# Patient Record
Sex: Male | Born: 1960 | Race: White | Hispanic: No | Marital: Single | Smoking: Current every day smoker
Health system: Southern US, Community
[De-identification: ages and names within clinical notes are randomized; demographics above are authoritative.]

---

## 2010-12-13 ENCOUNTER — Ambulatory Visit: Payer: Self-pay | Admitting: Family

## 2011-02-21 ENCOUNTER — Inpatient Hospital Stay: Payer: Self-pay | Admitting: Internal Medicine

## 2011-03-04 ENCOUNTER — Inpatient Hospital Stay: Payer: Self-pay | Admitting: Emergency Medicine

## 2012-04-07 IMAGING — CR DG CHEST 2V
1 series · 2 of 2 positions shown · non-contrast
Comparison: none

REASON FOR EXAM: cough,chronic
COMMENTS:

[Series 1: view not recorded · 0.17mm/px · 2 of 2 slices shown]
[im 1/2]
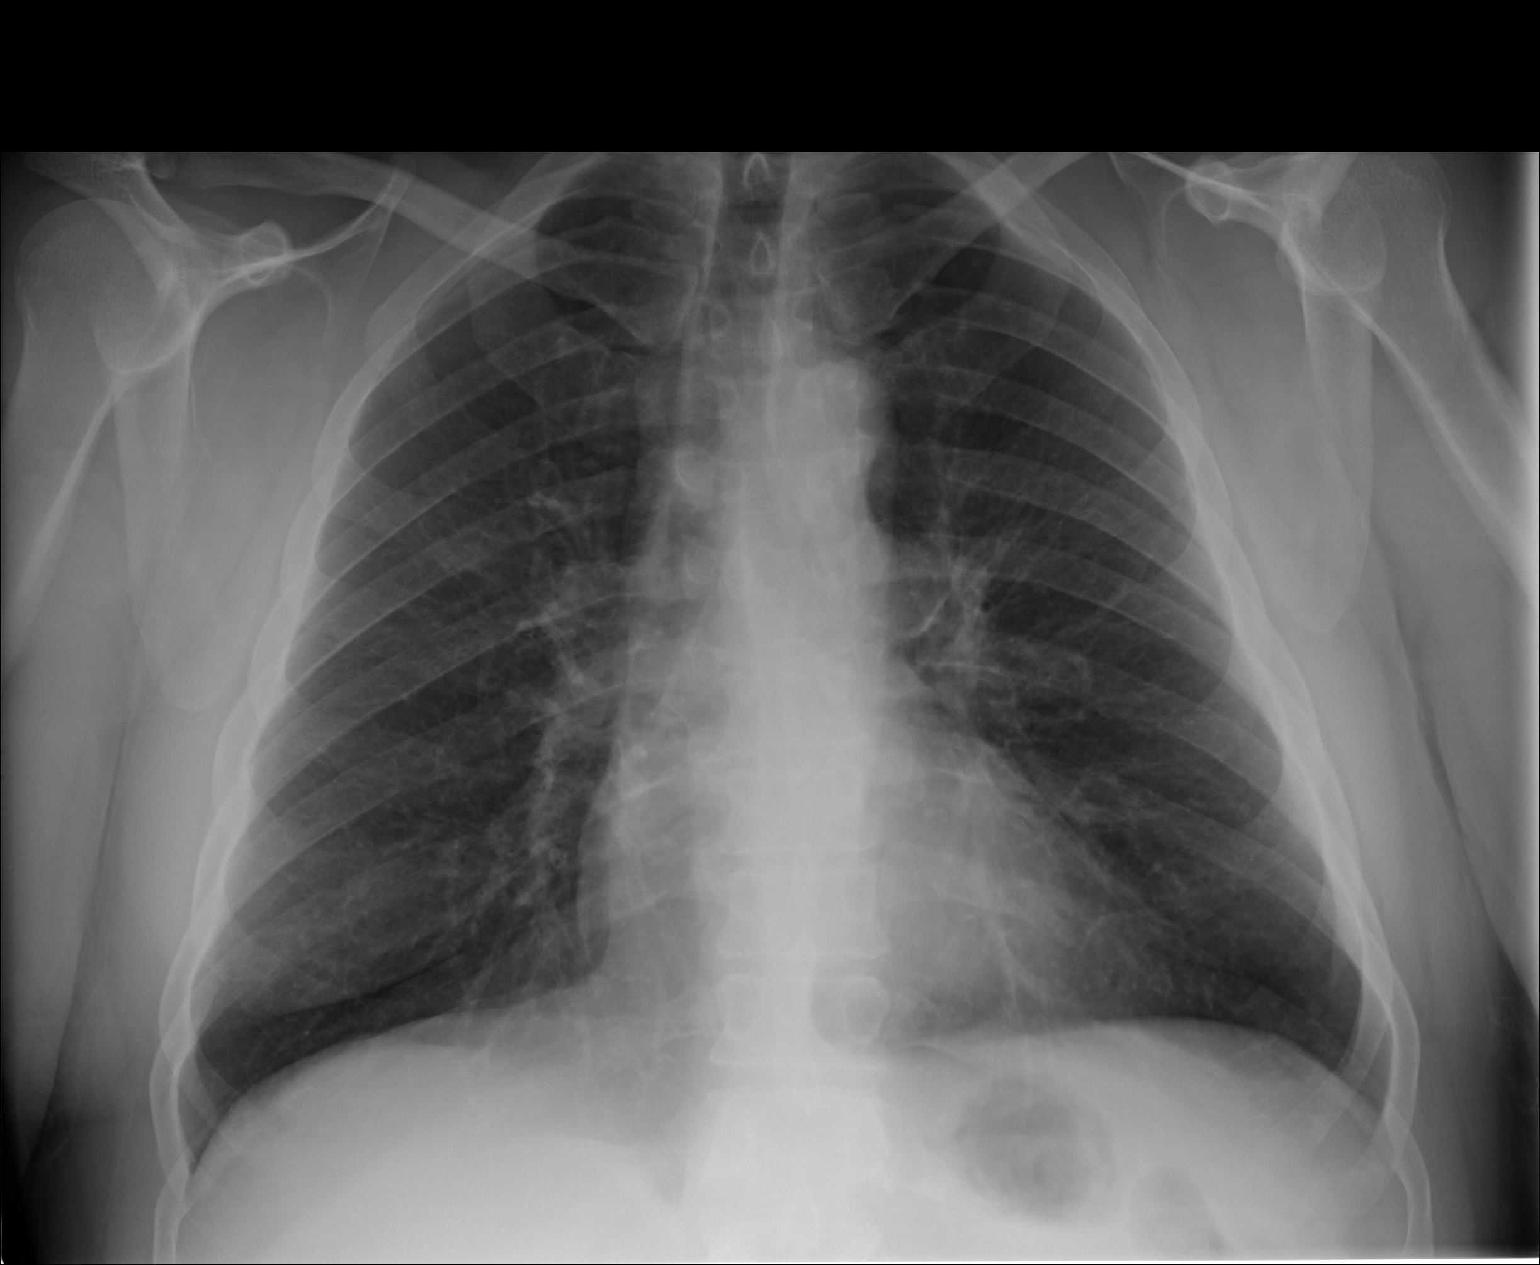
[im 2/2]
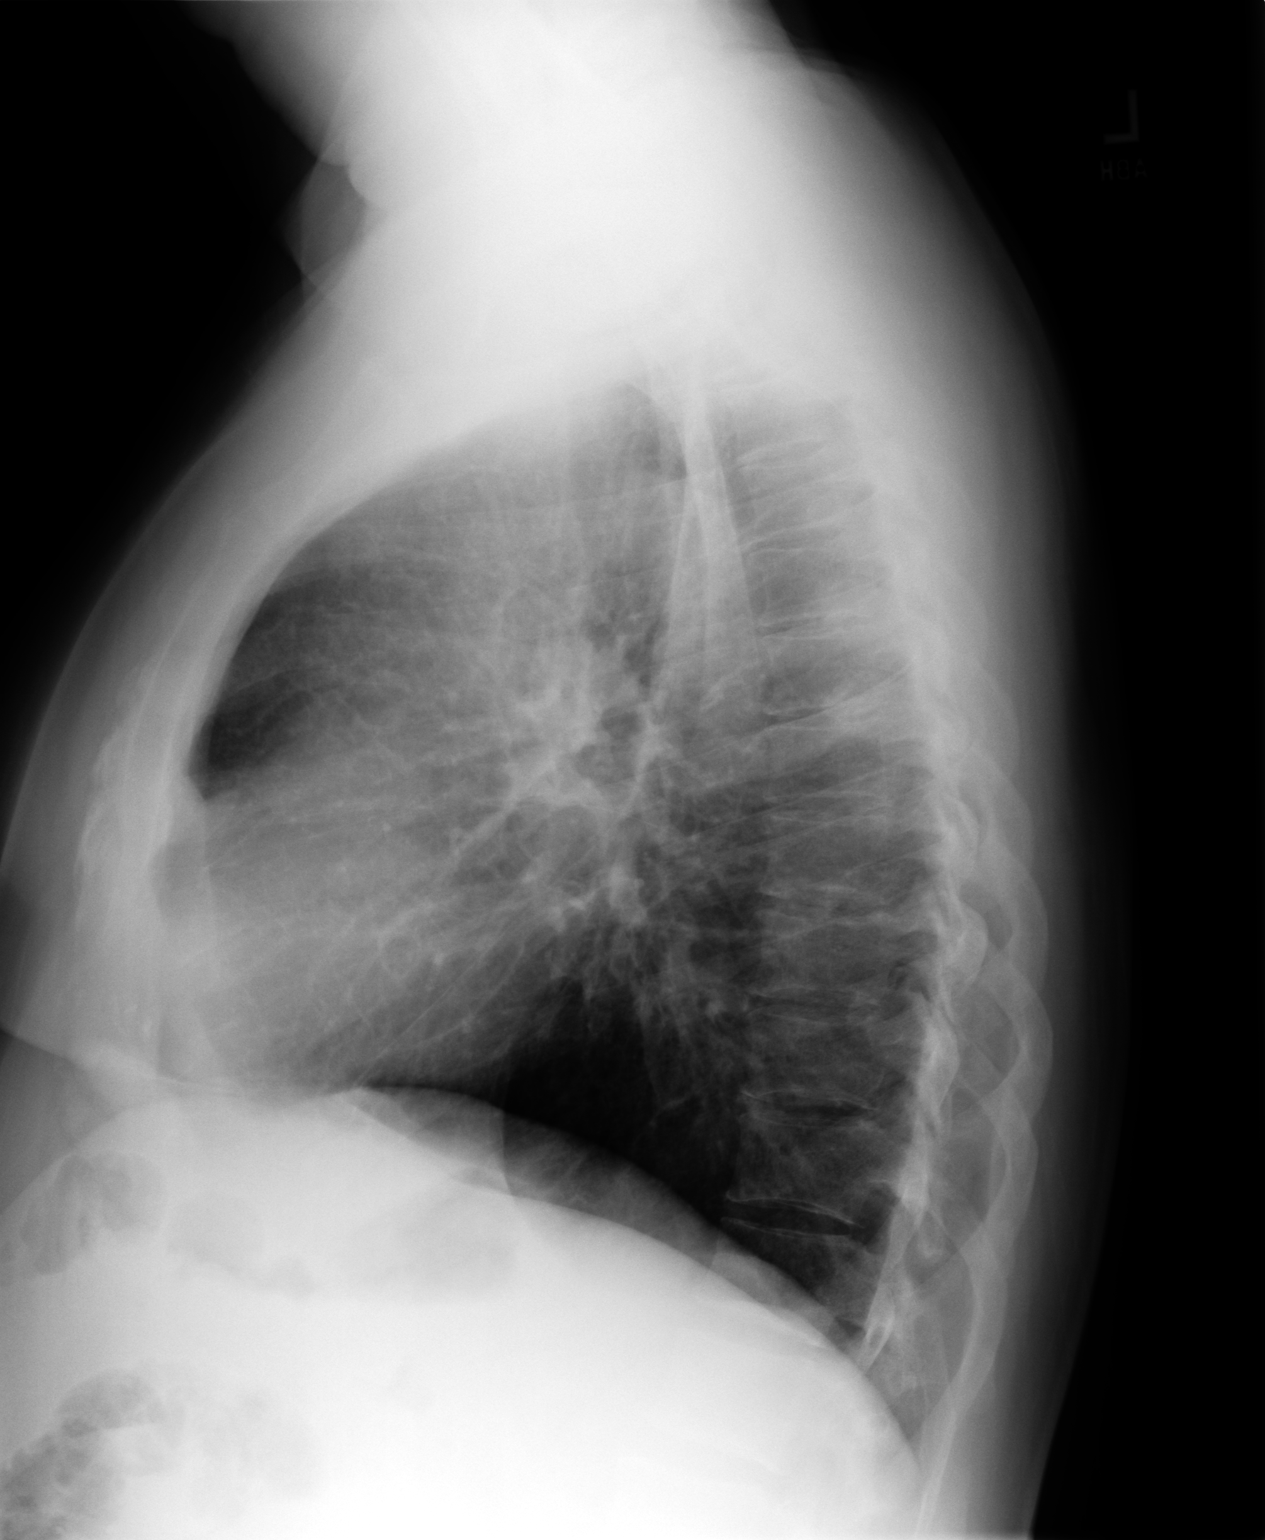

[2 of 2 positions shown; findings below may reference images not displayed]

PROCEDURE:     DXR - DXR CHEST PA (OR AP) AND LATERAL  - December 13, 2010 [DATE]

RESULT:     There is no previous exam for comparison.

The lungs are clear. The heart and pulmonary vessels are normal. The bony
and mediastinal structures are unremarkable. There is no effusion. There is
no pneumothorax or evidence of congestive failure.
IMPRESSION: No acute cardiopulmonary disease.

## 2012-06-26 IMAGING — CT CT ABD-PELV W/ CM
1 of 2 series · 15 of 32 positions shown, 19 images · IV contrast (isovue)
Comparison: none

REASON FOR EXAM: (1) abdominal pain; (2) same
COMMENTS:

PROCEDURE:     CT  - CT ABDOMEN / PELVIS  W  - March 04, 2011 [DATE]
RESULT:     Comparison:  None
TECHNIQUE: Multiple axial images of the abdomen and pelvis were performed
from the lung bases to the pubic symphysis, with p.o. contrast and with 100
mL of Isovue 370 intravenous contrast.

[Series 2: abdomen · axial · 0.78mm/px · z∈[-373,+62]mm · 15 of 95 slices shown, 19 images]
[im 4/95  soft-tissue]
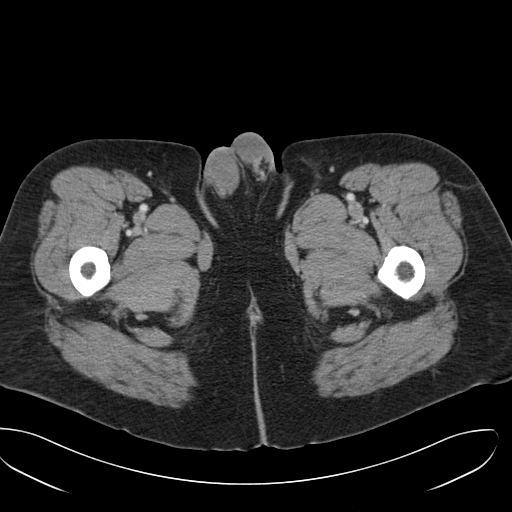
[im 4/95  bone]
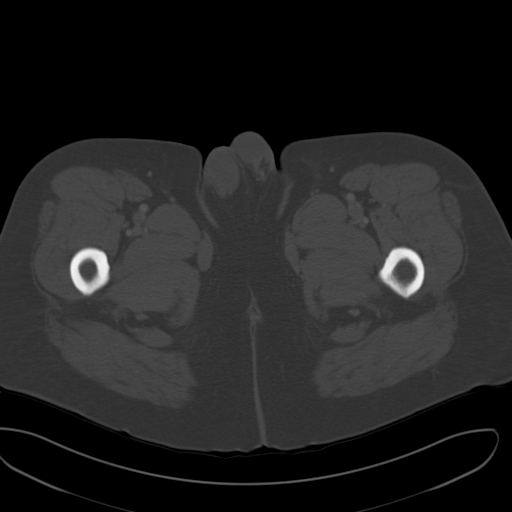
[im 11/95  soft-tissue]
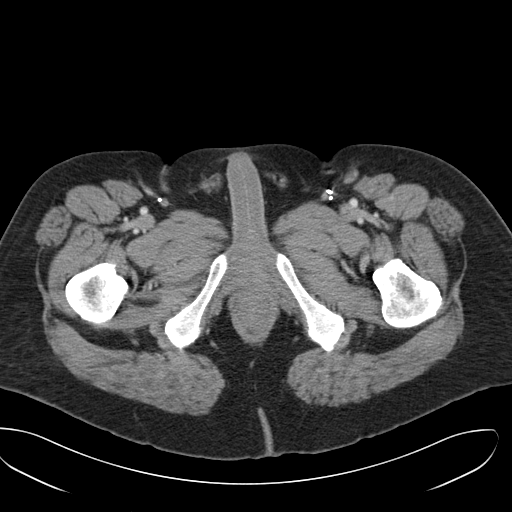
[im 19/95  soft-tissue]
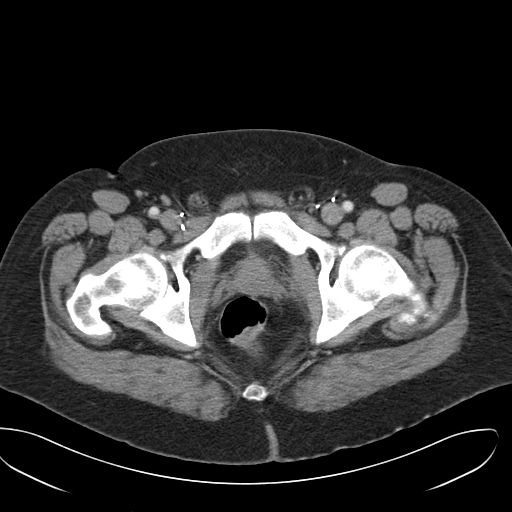
[im 26/95  soft-tissue]
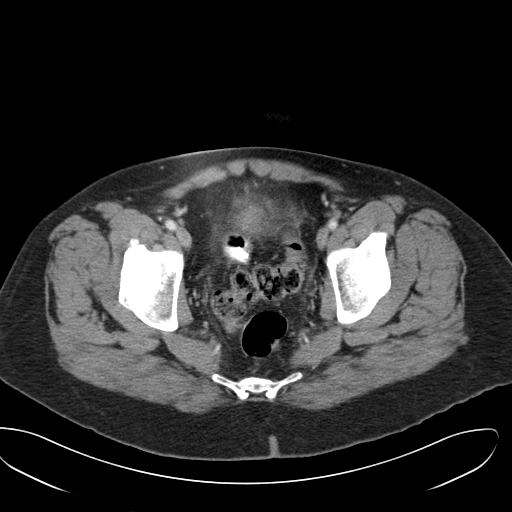
[im 33/95  soft-tissue]
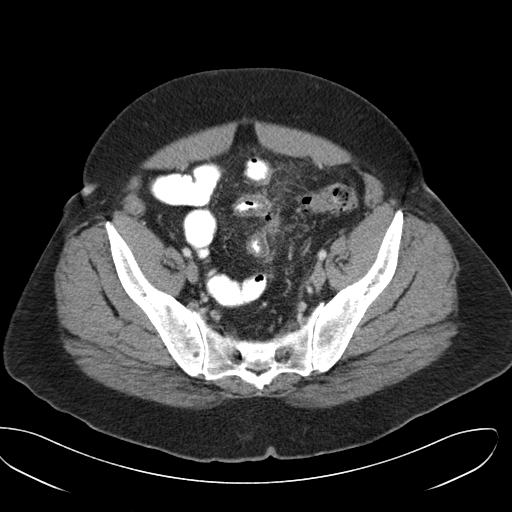
[im 40/95  soft-tissue]
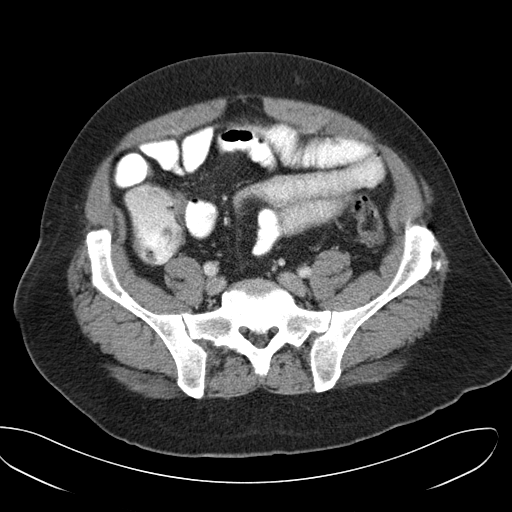
[im 48/95  soft-tissue]
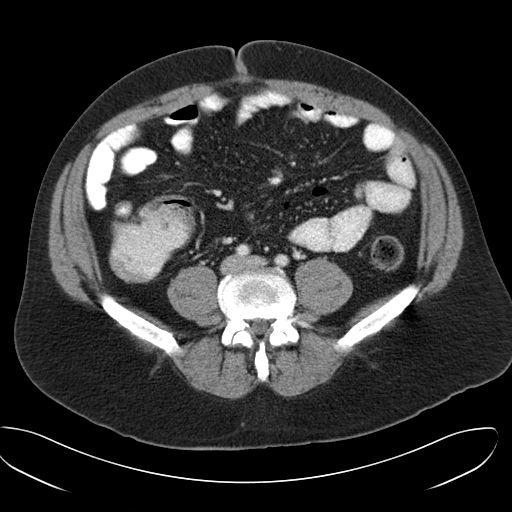
[im 55/95  soft-tissue]
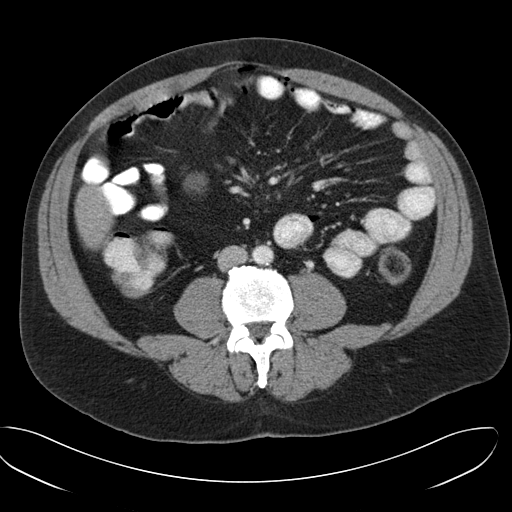
[im 62/95  soft-tissue]
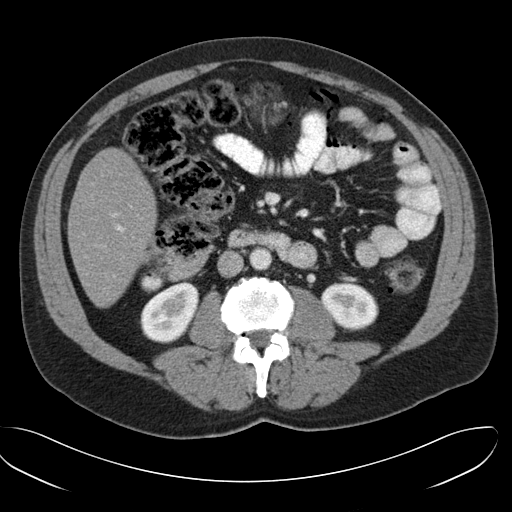
[im 62/95  bone]
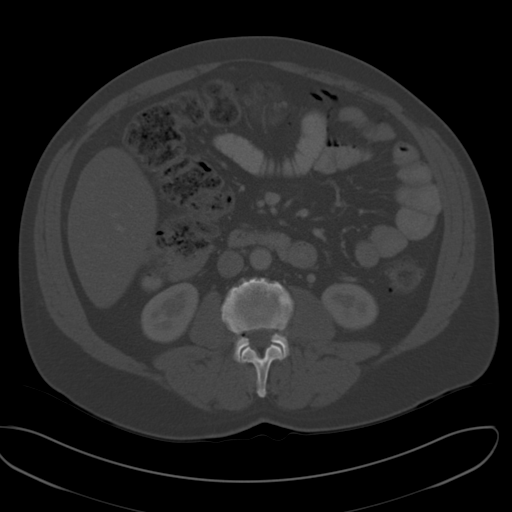
[im 69/95  soft-tissue]
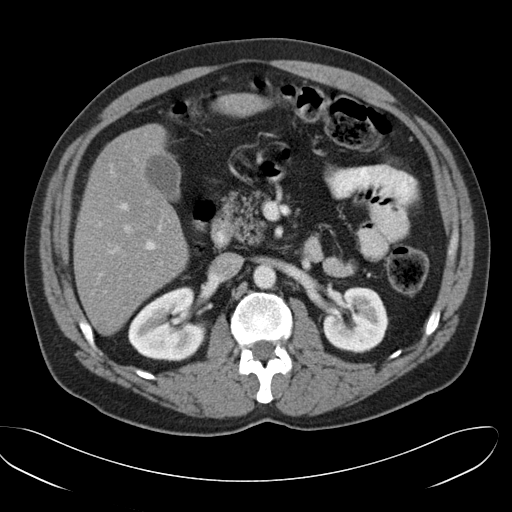
[im 76/95  soft-tissue]
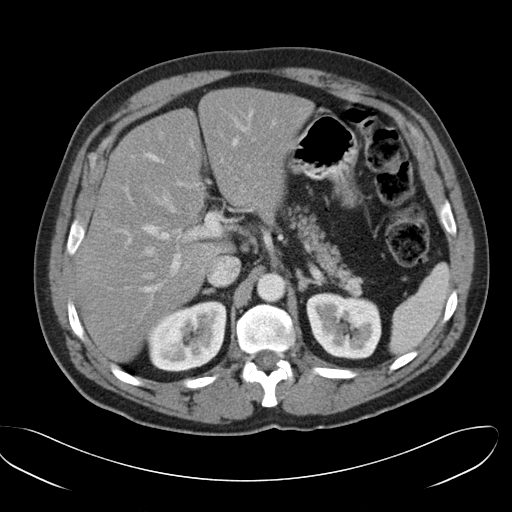
[im 80/95  lung]
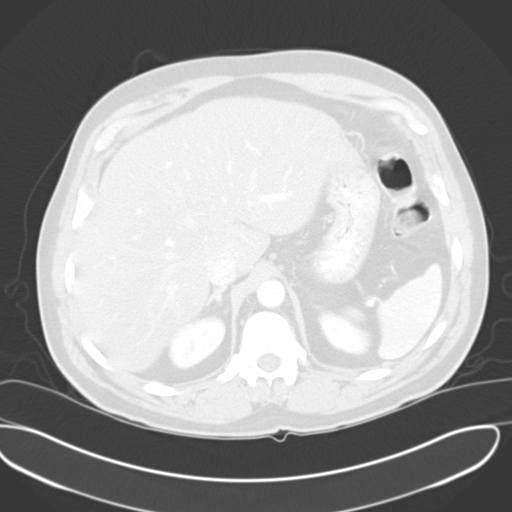
[im 84/95  soft-tissue]
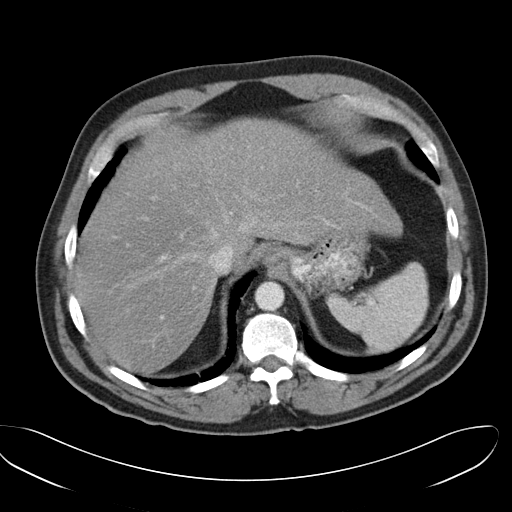
[im 84/95  lung]
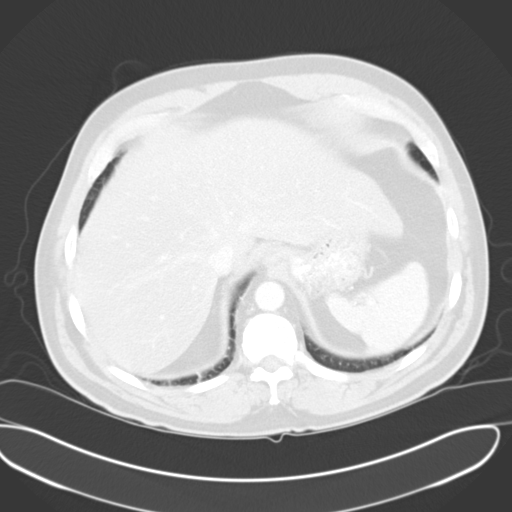
[im 87/95  lung]
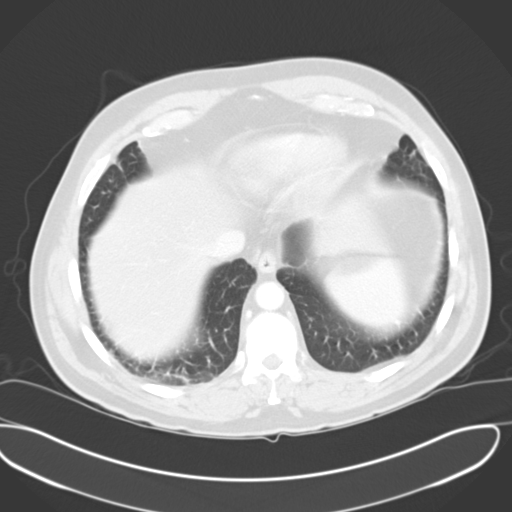
[im 91/95  soft-tissue]
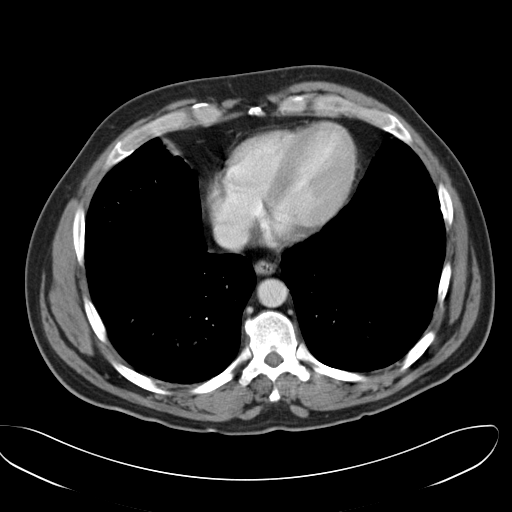
[im 91/95  lung]
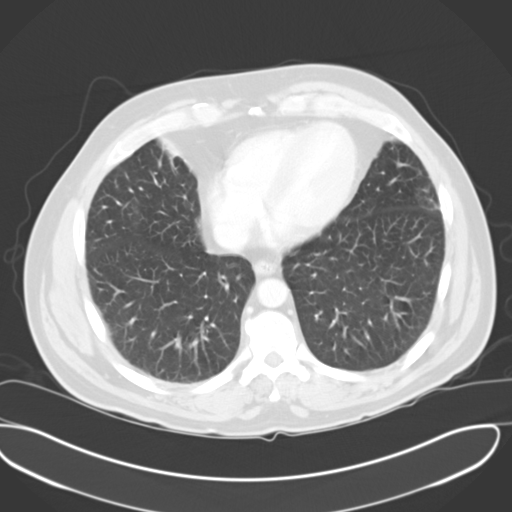

[15 of 32 positions shown; findings below may reference images not displayed]

FINDINGS: The liver is somewhat low in attenuation, suggesting hepatic steatosis. The
spleen, adrenals, pancreas, and gallbladder are unremarkable. The kidneys
enhance normally. No hydronephrosis.

There is focal bowel wall thickening of a loop of small bowel in the
anterior pelvis. There is diverticulosis of the sigmoid colon. There is mild
thickening of the sigmoid colon. There are adjacent extraluminal foci of
air, consistent perforation. There is mild thickening of the adjacent small
bowel loops, which is felt to be reactive.

The appendix is not definitely visualized. However, there are no
inflammatory changes around the base of the cecum. There is thickening of
the bladder wall, circumferentially. This may be related to underdistention.
Surgical clips are seen in the groin, bilaterally.

No aggressive lytic or sclerotic osseous lesions identified.
IMPRESSION: 1. Free intraperitoneal air within the pelvis and abdominal mesentery,
consistent with hollow viscus perforation. There is diverticulosis of the
sigmoid colon with mild bowel wall thickening, felt to represent perforated
sigmoid diverticulitis. Bowel wall thickening of adjacent small bowel is
felt to be reactive.
2. Thickening of the bladder wall may be secondary to underdistention. There
are adjacent inflammatory changes which could represent cystitis or reactive
changes from the aforementioned process. Correlate with urinalysis.

The preliminary report was called to Dr. Charley Adderley at 0338 hours
03/04/2011 by the preliminary radiologist Dr. Lumi Dehart.

## 2013-11-21 ENCOUNTER — Encounter: Payer: Self-pay | Admitting: Surgery

## 2017-11-30 ENCOUNTER — Encounter: Payer: BLUE CROSS/BLUE SHIELD | Attending: Surgery | Admitting: Surgery

## 2017-11-30 DIAGNOSIS — Z7984 Long term (current) use of oral hypoglycemic drugs: Secondary | ICD-10-CM | POA: Insufficient documentation

## 2017-11-30 DIAGNOSIS — J449 Chronic obstructive pulmonary disease, unspecified: Secondary | ICD-10-CM | POA: Insufficient documentation

## 2017-11-30 DIAGNOSIS — E119 Type 2 diabetes mellitus without complications: Secondary | ICD-10-CM | POA: Insufficient documentation

## 2017-11-30 DIAGNOSIS — K435 Parastomal hernia without obstruction or  gangrene: Secondary | ICD-10-CM | POA: Diagnosis not present

## 2017-11-30 DIAGNOSIS — E11622 Type 2 diabetes mellitus with other skin ulcer: Secondary | ICD-10-CM | POA: Diagnosis not present

## 2017-11-30 DIAGNOSIS — F17218 Nicotine dependence, cigarettes, with other nicotine-induced disorders: Secondary | ICD-10-CM | POA: Diagnosis not present

## 2017-11-30 DIAGNOSIS — X58XXXA Exposure to other specified factors, initial encounter: Secondary | ICD-10-CM | POA: Insufficient documentation

## 2017-11-30 DIAGNOSIS — S31104A Unspecified open wound of abdominal wall, left lower quadrant without penetration into peritoneal cavity, initial encounter: Secondary | ICD-10-CM | POA: Insufficient documentation

## 2017-12-01 NOTE — Progress Notes (Signed)
KAMARIAN, SAHAKIAN ([id number]) Visit Report for 11/30/2017 Chief Complaint Document Details Patient Name: Stephen [name], Stephen [name] Date of Service: 11/30/2017 8:00 AM Medical Record Number: [id number] Patient Account Number: [id number] Date of Birth/Sex: Nov 07, 1961 (56 y.o. Male) Treating RN: Curtis Sites Primary Care Provider: Karie Fetch Other Clinician: Referring Provider: Karie Fetch Treating Provider/Extender: Rudene Re in Treatment: 0 Information Obtained from: Patient Chief Complaint ulcerated area around his left stoma for about 3 months Electronic Signature(s) Signed: 11/30/2017 9:15:26 AM By: Evlyn Kanner MD, FACS Entered By: Evlyn Kanner on 11/30/2017 09:15:26 Stephen [name], [name] ([id number]) -------------------------------------------------------------------------------- HPI Details Patient Name: Stephen [name] Date of Service: 11/30/2017 8:00 AM Medical Record Number: [id number] Patient Account Number: [id number] Date of Birth/Sex: 02-29-1961 (56 y.o. 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 Severity: Patient states wound are getting worse. Duration: Patient has had the wound for > 3 months prior to seeking treatment at the wound center Timing: Pain in wound is Intermittent (comes and goes Context: The wound would happen gradually Modifying Factors: Consults to this date include:PCP had given him a course of antibiotics Associated Signs and Symptoms: his stoma is functioning well HPI Description: 56 year old gentleman who is known to be a diabetic and smokes 2 packs of cigarettes a day, was referred by his PCP for a peristomal ulceration which she's had  for about 3 months. The patient was treated with Bactrim for a while and says it got a little better. He was referred to as for further care. Past medical history significant for diabetes mellitus which is not controlled well and has hyperglycemia and his last hemoglobin A1c was 8.6%. He also has known to have penile cancer in 2006 and was seen in its [facility] is treated for COPD and has had a bowel perforation which was possibly perforated diverticulitis which was treated with surgery in March 2012. 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 I understand he was seen at [facility] by the general surgeons in 2013 and they told him to take care of his diabetes mellitus and stop smoking and come back for a review. The patient has not gone back for an opinion regarding this. Has noted he smokes 2 packets of cigarettes a day since the age of 32. ====== Old notes Mr. Manlove is a 55 year old male with history of diabetes and smoking who presents with skin irritation around his left lower quadrant ostomy site. Approximately 3 years ago he was in the hospital for a colon perforation which required a placement of an ostomy. Unfortunately for Mr. Vanwieren, he has continued to smoke which has precluded his surgeon from reversing his ostomy. He states that his blood sugars are in reasonable control at home. He was referred to the wound clinic because of skin irritation around his ostomy site. At present he did not have open wounds around the ostomy site. 2 weeks ago he saw a surgeon who placed Duoderm around the ostomy itself which has helped some of the skin irritation. He also bought a new ostomy appliance with less adhesive which also helped to starting skin irritation. He denies fever or chills at home. =========== Electronic Signature(s) Signed: 11/30/2017 9:15:33 AM By: Evlyn Kanner MD,  FACS Previous Signature: 11/30/2017 9:11:16 AM  Version By: Evlyn Kanner MD, FACS Entered By: Evlyn Kanner on 11/30/2017 09:15:33 Stephen [name], Stephen [name] Kitchen ([id number]) -------------------------------------------------------------------------------- Physical Exam Details Patient Name: Stephen [name] Date of Service: 11/30/2017 8:00 AM Medical Record Number: [id number] Patient Account Number: [id number] Date of Birth/Sex: 1961/01/01 (56 y.o. Male) Treating RN: Curtis Sites Primary Care Provider: Karie Fetch Other Clinician: Referring Provider: Karie Fetch Treating Provider/Extender: Rudene Re in Treatment: 0 Constitutional . Pulse regular. Respirations normal and unlabored. Afebrile. . Eyes Nonicteric. Reactive to light. Ears, Nose, Mouth, and Throat Lips, teeth, and gums WNL.Marland Kitchen Moist mucosa without lesions. Neck supple and nontender. No palpable supraclavicular or cervical adenopathy. Normal sized without goiter. Respiratory WNL. No retractions.. Cardiovascular Pedal Pulses WNL. No clubbing, cyanosis or edema. Gastrointestinal (GI) Abdomen without masses or tenderness.. No liver or spleen enlargement or tenderness.. Lymphatic No adneopathy. No adenopathy. No adenopathy. Musculoskeletal Adexa without tenderness or enlargement.. Digits and nails w/o clubbing, cyanosis, infection, petechiae, ischemia, or inflammatory conditions.. Integumentary (Hair, Skin) No suspicious lesions. No crepitus or fluctuance. No peri-wound warmth or erythema. No masses.Marland Kitchen Psychiatric Judgement and insight Intact.. No evidence of depression, anxiety, or agitation.. Notes large parastomal hernia in the left lower quadrant which is huge and does not show signs of obstruction. Just above the colostomy site he has a scar tissue which is now ulcerated superficially and has healthy granulation tissue and no sharp debridement was required. Electronic Signature(s) Signed: 11/30/2017 9:16:24 AM By: Evlyn Kanner MD, FACS Entered By: Evlyn Kanner on 11/30/2017 09:16:24 Stephen [name], Stephen [name] ([id number]) -------------------------------------------------------------------------------- Physician Orders Details Patient Name: Stephen [name] Date of Service: 11/30/2017 8:00 AM Medical Record Number: [id number] Patient Account Number: [id number] Date of Birth/Sex: 1961-11-17 (56 y.o. Male) Treating RN: Curtis Sites Primary Care Provider: Karie Fetch Other Clinician: Referring Provider: Karie Fetch Treating Provider/Extender: Rudene Re in Treatment: 0 Verbal / Phone Orders: No Diagnosis Coding Wound Cleansing Wound #1 Left Abdomen - Lower Quadrant o Clean wound with Normal Saline. o May Shower, gently pat wound dry prior to applying new dressing. Primary Wound Dressing Wound #1 Left Abdomen - Lower Quadrant o Silvercel Non-Adherent Secondary Dressing Wound #1 Left Abdomen - Lower Quadrant o Dry Gauze Dressing Change Frequency Wound #1 Left Abdomen - Lower Quadrant o Other: - change as often as you need Follow-up Appointments o Return Appointment in 1 week. Additional Orders / Instructions Wound #1 Left Abdomen - Lower Quadrant o Stop Smoking - please try to quit smoking o Vitamin A; Vitamin C, Zinc - over the counter supplements o Increase protein intake. Electronic Signature(s) Signed: 11/30/2017 4:29:35 PM By: Curtis Sites Signed: 11/30/2017 5:09:21 PM By: Evlyn Kanner MD, FACS Entered By: Curtis Sites on 11/30/2017 08:48:04 Stephen [name], [name] ([id number]) -------------------------------------------------------------------------------- Problem List Details Patient Name: Stephen [name] Date of Service: 11/30/2017 8:00 AM Medical Record Number: [id number] Patient Account Number: [id number] Date of Birth/Sex: [birth year] (56 y.o. Male) Treating RN: Curtis Sites Primary Care Provider: Karie Fetch Other Clinician: Referring Provider: Karie Fetch Treating  Provider/Extender: Rudene Re in Treatment: 0 Active Problems ICD-10 Encounter Code Description Active Date Diagnosis E11.622 Type 2 diabetes mellitus with other skin ulcer 11/30/2017 Yes F17.218 Nicotine dependence, cigarettes, with other nicotine-induced 11/30/2017 Yes disorders K43.5 Parastomal hernia without obstruction or gangrene 11/30/2017 Yes S31.104A Unspecified open wound of abdominal wall, left lower quadrant 11/30/2017 Yes without penetration into peritoneal cavity, initial encounter Inactive Problems Resolved Problems Electronic Signature(s) Signed: 11/30/2017 9:14:59 AM By: Evlyn Kanner MD, FACS Entered  By: Evlyn KannerBritto, Marley Charlot on 11/30/2017 09:14:59 Stephen [name], [name] ([id number]) -------------------------------------------------------------------------------- Progress Note Details Patient Name: Stephen MaisCHANDLER, [name] Date of Service: 11/30/2017 8:00 AM Medical Record Number: [id number] Patient Account Number: [id number] Date of Birth/Sex: 1961-10-28 49(56 y.o. Male) Treating RN: Curtis Sitesorthy, Joanna Primary Care Provider: Karie FetchAYCOCK, NGWE Other Clinician: Referring Provider: Karie FetchAYCOCK, NGWE Treating Provider/Extender: Rudene ReBritto, Kerolos Nehme Weeks in Treatment: 0 Subjective Chief Complaint Information obtained from Patient ulcerated area around his left stoma for about 3 months History of Present Illness (HPI) The following HPI elements were documented for the patient's wound: Location: peristomal ulceration in the left lower quadrant Quality: Patient reports experiencing a dull pain to affected area(s). Severity: Patient states wound are getting worse. Duration: Patient has had the wound for > 3 months prior to seeking treatment at the wound center Timing: Pain in wound is Intermittent (comes and goes Context: The wound would happen gradually Modifying Factors: Consults to this date include:PCP had given him a course of antibiotics Associated Signs and Symptoms: his stoma is  functioning well 56 year old gentleman who is known to be a diabetic and smokes 2 packs of cigarettes a day, was referred by his PCP for a peristomal ulceration which she's had for about 3 months. The patient was treated with Bactrim for a while and says it got a little better. He was referred to as for further care. Past medical history significant for diabetes mellitus which is not controlled well and has hyperglycemia and his last hemoglobin A1c was 8.6%. He also has known to have penile cancer in 2006 and was [facility] is treated for COPD and has had a bowel perforation which was possibly perforated diverticulitis which was treated with surgery in March 2012. 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 I understand [facility] was [facility] by the general surgeons in 2013 and they told him to take care of his diabetes mellitus and stop smoking and come back for a review. The patient has not gone back for an opinion regarding this. Has noted he smokes 2 packets of cigarettes a day since the age of 56. ====== Old notes [name] is a 56 year old male with history of diabetes and smoking who presents with skin irritation around his left lower quadrant ostomy site. Approximately 3 years ago he was in the hospital for a colon perforation which required a placement of an ostomy. Unfortunately for [name], he has continued to smoke which has precluded his surgeon from reversing his ostomy. He states that his blood sugars are in reasonable control at home. He was referred to the wound clinic because of skin irritation around his ostomy site. At present he did not have open wounds around the ostomy site. 2 weeks ago he saw a surgeon who placed Duoderm around the ostomy itself which has helped some of the skin irritation. He also bought a new ostomy appliance with less adhesive which also helped to starting skin  irritation. He denies fever or chills at home. =========== Wound History Patient presents with 1 open wound that has been present for approximately 2 months. Patient has been treating wound in the following manner: duoderm. Laboratory tests have been performed in the last month. Patient reportedly has tested positive for an antibiotic resistant organism. Patient reportedly has not tested positive for osteomyelitis. Patient reportedly has not had testing performed to evaluate circulation in the legs. Patient History Locust, Kyrin L. (  [id number]) Information obtained from Patient. Allergies No Known Allergies Family History Diabetes - Father, Heart Disease - Father, Thyroid Problems - Child, No family history of Cancer, Hereditary Spherocytosis, Hypertension, Kidney Disease, Lung Disease, Seizures, Stroke, Tuberculosis. Social History Current every day smoker, Marital Status - Married, Alcohol Use - Never, Drug Use - No History, Caffeine Use - Daily. Medical History Hematologic/Lymphatic Denies history of Anemia, Hemophilia, Human Immunodeficiency Virus, Lymphedema, Sickle Cell Disease Respiratory Patient has history of Chronic Obstructive Pulmonary Disease (COPD) Denies history of Aspiration, Asthma, Pneumothorax, Sleep Apnea, Tuberculosis Cardiovascular Denies history of Angina, Arrhythmia, Congestive Heart Failure, Coronary Artery Disease, Deep Vein Thrombosis, Hypertension, Hypotension, Myocardial Infarction, Peripheral Arterial Disease, Peripheral Venous Disease, Phlebitis, Vasculitis Immunological Denies history of Lupus Erythematosus, Raynaud s, Scleroderma Integumentary (Skin) Denies history of History of Burn, History of pressure wounds Musculoskeletal Denies history of Gout, Rheumatoid Arthritis, Osteoarthritis, Osteomyelitis Neurologic Denies history of Dementia, Neuropathy, Quadriplegia, Paraplegia, Seizure Disorder Oncologic Denies history of Received Chemotherapy,  Received Radiation Psychiatric Denies history of Anorexia/bulimia, Confinement Anxiety Medical And Surgical History Notes Gastrointestinal colostomy r/t diverticulitis 2012 Review of Systems (ROS) Constitutional Symptoms (General Health) The patient has no complaints or symptoms. Eyes The patient has no complaints or symptoms. Ear/Nose/Mouth/Throat The patient has no complaints or symptoms. Hematologic/Lymphatic The patient has no complaints or symptoms. Respiratory The patient has no complaints or symptoms. Cardiovascular The patient has no complaints or symptoms. Genitourinary The patient has no complaints or symptoms. Immunological The patient has no complaints or symptoms. Integumentary (Skin) The patient has no complaints or symptoms. Stephen [name], [name] ([id number]) Musculoskeletal The patient has no complaints or symptoms. Neurologic The patient has no complaints or symptoms. Oncologic The patient has no complaints or symptoms. Psychiatric The patient has no complaints or symptoms. Medications metformin Objective Constitutional Pulse regular. Respirations normal and unlabored. Afebrile. Vitals Time Taken: 8:23 AM, Height: 68 in, Source: Measured, Weight: 196 lbs, Source: Measured, BMI: 29.8, Temperature: 97.8 F, Pulse: 76 bpm, Respiratory Rate: 18 breaths/min, Blood Pressure: 133/73 mmHg. Eyes Nonicteric. Reactive to light. Ears, Nose, Mouth, and Throat Lips, teeth, and gums WNL.Marland Kitchen Moist mucosa without lesions. Neck supple and nontender. No palpable supraclavicular or cervical adenopathy. Normal sized without goiter. Respiratory WNL. No retractions.. Cardiovascular Pedal Pulses WNL. No clubbing, cyanosis or edema. Gastrointestinal (GI) Abdomen without masses or tenderness.. No liver or spleen enlargement or tenderness.. Lymphatic No adneopathy. No adenopathy. No adenopathy. Musculoskeletal Adexa without tenderness or enlargement.. Digits and nails w/o  clubbing, cyanosis, infection, petechiae, ischemia, or inflammatory conditions.Marland Kitchen Psychiatric Judgement and insight Intact.. No evidence of depression, anxiety, or agitation.Marland Kitchen Uzelac, Nishant L. ([id number]) General Notes: large parastomal hernia in the left lower quadrant which is huge and does not show signs of obstruction. Just above the colostomy site he has a scar tissue which is now ulcerated superficially and has healthy granulation tissue and no sharp debridement was required. Integumentary (Hair, Skin) No suspicious lesions. No crepitus or fluctuance. No peri-wound warmth or erythema. No masses.. Wound #1 status is Open. Original cause of wound was Gradually Appeared. The wound is located on the Left Abdomen - Lower Quadrant. The wound measures 0.3cm length x 0.3cm width x 0.1cm depth; 0.071cm^2 area and 0.007cm^3 volume. There is no tunneling or undermining noted. There is a large amount of serous drainage noted. The wound margin is flat and intact. There is large (67-100%) red granulation within the wound bed. There is no necrotic tissue within the wound bed. The periwound skin appearance exhibited: Scarring. The periwound skin  appearance did not exhibit: Callus, Crepitus, Excoriation, Induration, Rash, Dry/Scaly, Maceration, Atrophie Blanche, Cyanosis, Ecchymosis, Hemosiderin Staining, Mottled, Pallor, Rubor, Erythema. Periwound temperature was noted as No Abnormality. Assessment Active Problems ICD-10 E11.622 - Type 2 diabetes mellitus with other skin ulcer F17.218 - Nicotine dependence, cigarettes, with other nicotine-induced disorders K43.5 - Parastomal hernia without obstruction or gangrene S31.104A - Unspecified open wound of abdominal wall, left lower quadrant without penetration into peritoneal cavity, initial encounter this 56 year old diabetic who is also a heavy smoker has had a huge parastomal hernia which has not been repaired [facility]  because of his uncontrolled diabetes and his heavy smoking. The patient is most nonchalant about his problems and says he is too busy to manage his diabetes or quit smoking. after a thorough review I have recommended: 1. Silver alginate and a 2 x 2 gauze to be placed over the ulcerated area and where his stoma bag above this. 2. Good control of his diabetes mellitus 3. Adequate protein, vitamin A, vitamin C and zinc 4. I have spent over 3 minutes discussing with him the need to completely give up smoking and have discussed the risks benefits alternatives and all the possible complications 5. Discussed the need to see a surgeon as soon as possible as this large complex hernia has a high probability of getting obstructed and causing strangulation. After discussing with them in great detail and answering all his questions he says he is going to work on this and will try and be compliant. He will come regularly for wound care visits. Plan Wound Cleansing: Wound #1 Left Abdomen - Lower Quadrant: Clean wound with Normal Saline. May Shower, gently pat wound dry prior to applying new dressing. Stephen [name], [name] ([id number]) Primary Wound Dressing: Wound #1 Left Abdomen - Lower Quadrant: Silvercel Non-Adherent Secondary Dressing: Wound #1 Left Abdomen - Lower Quadrant: Dry Gauze Dressing Change Frequency: Wound #1 Left Abdomen - Lower Quadrant: Other: - change as often as you need Follow-up Appointments: Return Appointment in 1 week. Additional Orders / Instructions: Wound #1 Left Abdomen - Lower Quadrant: Stop Smoking - please try to quit smoking Vitamin A; Vitamin C, Zinc - over the counter supplements Increase protein intake. this 56 year old diabetic who is also a heavy smoker has had a huge parastomal hernia which has [facility] been [facility] because of his uncontrolled diabetes and his heavy smoking. The patient is most nonchalant about his problems and  says he is too busy to manage his diabetes or quit smoking. after a thorough review I have recommended: 1. Silver alginate and a 2 x 2 gauze to be placed over the ulcerated area and where his stoma bag above this. 2. Good control of his diabetes mellitus 3. Adequate protein, vitamin A, vitamin C and zinc 4. I have spent over 3 minutes discussing with him the need to completely give up smoking and have discussed the risks benefits alternatives and all the possible complications 5. Discussed the need to see a surgeon as soon as possible as this large complex hernia has a high probability of getting obstructed and causing strangulation. After discussing with them in great detail and answering all his questions he says he is going to work on this and will try and be compliant. He will come regularly for wound care visits. Electronic Signature(s) Signed: 11/30/2017 9:19:03 AM By: Evlyn KannerBritto, Arren Laminack MD, FACS Entered By: Evlyn KannerBritto, Jobin Montelongo on 11/30/2017 09:19:03 Stephen [name], [name] ([id number]) --------------------------------------------------------------------------------  ROS/PFSH Details Patient Name: Stephen [name], Stephen [name] Date of Service: 11/30/2017 8:00 AM Medical Record Number: [id number] Patient Account Number: [id number] Date of Birth/Sex: Dec 24, 1960 (56 y.o. Male) Treating RN: Curtis Sites Primary Care Provider: Karie Fetch Other Clinician: Referring Provider: Karie Fetch Treating Provider/Extender: Rudene Re in Treatment: 0 Information Obtained From Patient Wound History Do you currently have one or more open woundso Yes How many open wounds do you currently haveo 1 Approximately how long have you had your woundso 2 months How have you been treating your wound(s) until nowo duoderm Has your wound(s) ever healed and then re-openedo No Have you had any lab work done in the past montho Yes Who ordered the lab work doneo PCP Have you tested positive for an antibiotic resistant  organism (MRSA, VRE)o Yes Date: 12/05/2012 Have you tested positive for osteomyelitis (bone infection)o No Have you had any tests for circulation on your legso No Constitutional Symptoms (General Health) Complaints and Symptoms: No Complaints or Symptoms Eyes Complaints and Symptoms: No Complaints or Symptoms Ear/Nose/Mouth/Throat Complaints and Symptoms: No Complaints or Symptoms Hematologic/Lymphatic Complaints and Symptoms: No Complaints or Symptoms Medical History: Negative for: Anemia; Hemophilia; Human Immunodeficiency Virus; Lymphedema; Sickle Cell Disease Respiratory Complaints and Symptoms: No Complaints or Symptoms Medical History: Positive for: Chronic Obstructive Pulmonary Disease (COPD) Negative for: Aspiration; Asthma; Pneumothorax; Sleep Apnea; Tuberculosis Cardiovascular Heckart, Gabriela L. ([id number]) Complaints and Symptoms: No Complaints or Symptoms Medical History: Negative for: Angina; Arrhythmia; Congestive Heart Failure; Coronary Artery Disease; Deep Vein Thrombosis; Hypertension; Hypotension; Myocardial Infarction; Peripheral Arterial Disease; Peripheral Venous Disease; Phlebitis; Vasculitis Gastrointestinal Medical History: Negative for: Cirrhosis ; Colitis; Crohnos; Hepatitis A; Hepatitis B; Hepatitis C Past Medical History Notes: colostomy r/t diverticulitis 2012 Endocrine Medical History: Positive for: Type II Diabetes Negative for: Type I Diabetes Time with diabetes: 6 years Treated with: Oral agents Blood sugar tested every day: No Genitourinary Complaints and Symptoms: No Complaints or Symptoms Immunological Complaints and Symptoms: No Complaints or Symptoms Medical History: Negative for: Lupus Erythematosus; Raynaudos; Scleroderma Integumentary (Skin) Complaints and Symptoms: No Complaints or Symptoms Medical History: Negative for: History of Burn; History of pressure wounds Musculoskeletal Complaints and Symptoms: No  Complaints or Symptoms Medical History: Negative for: Gout; Rheumatoid Arthritis; Osteoarthritis; Osteomyelitis Neurologic Complaints and Symptoms: No Complaints or Symptoms Medical History: Towson, Juanpablo L. ([id number]) Negative for: Dementia; Neuropathy; Quadriplegia; Paraplegia; Seizure Disorder Oncologic Complaints and Symptoms: No Complaints or Symptoms Medical History: Negative for: Received Chemotherapy; Received Radiation Psychiatric Complaints and Symptoms: No Complaints or Symptoms Medical History: Negative for: Anorexia/bulimia; Confinement Anxiety Immunizations Pneumococcal Vaccine: Received Pneumococcal Vaccination: No Implantable Devices Family and Social History Cancer: No; Diabetes: Yes - Father; Heart Disease: Yes - Father; Hereditary Spherocytosis: No; Hypertension: No; Kidney Disease: No; Lung Disease: No; Seizures: No; Stroke: No; Thyroid Problems: Yes - Child; Tuberculosis: No; Current every day smoker; Marital Status - Married; Alcohol Use: Never; Drug Use: No History; Caffeine Use: Daily; Financial Concerns: Yes; Food, Clothing or Shelter Needs: No; Support System Lacking: No; Transportation Concerns: No; Advanced Directives: No; Patient does not want information on Advanced Directives; Do not resuscitate: No; Living Will: No; Medical Power of Attorney: No Physician Affirmation I have reviewed and agree with the above information. Electronic Signature(s) Signed: 11/30/2017 4:29:35 PM By: Curtis Sites Signed: 11/30/2017 5:09:21 PM By: Evlyn Kanner MD, FACS Entered By: Evlyn Kanner on 11/30/2017 09:11:50 Paradis, Thadd L. ([id number]) -------------------------------------------------------------------------------- SuperBill Details Patient Name: Stephen [name] Date of Service: 11/30/2017 Medical Record Number: [id number] Patient Account Number: [id number]  Date of Birth/Sex: 21-Jul-1961 (56 y.o. 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, cigarettes, with other nicotine-induced disorders K43.5 Parastomal hernia without obstruction or gangrene Unspecified open wound of abdominal wall, left lower quadrant without penetration into peritoneal cavity, S31.104A initial encounter Facility Procedures CPT4: Description Modifier Quantity Code 53664403 99213 - WOUND CARE VISIT-LEV 3 EST PT 1 CPT4: 47425956 99406-SMOKING CESSATION 3-10MINS 1 ICD-10 Diagnosis Description 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, cigarettes, with other nicotine-induced disorders K43.5 Parastomal hernia without  obstruction or gangrene S31.104A Unspecified open wound of abdominal wall, left lower quadrant without penetration into peritoneal cavity, initial encounter Physician Procedures CPT4: Description Modifier Quantity Code 3875643 99204 - WC PHYS LEVEL 4 - NEW PT 1 ICD-10 Diagnosis Description 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, cigarettes, with other nicotine-induced disorders K43.5  Parastomal hernia without obstruction or gangrene S31.104A Unspecified open wound of abdominal wall, left lower quadrant without penetration into peritoneal cavity, initial encounter CPT4: 99406 99406- SMOKING CESSATION 3-10 MINS 1 ICD-10 Diagnosis Description 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 ([id number]) Electronic  Signature(s) Signed: 11/30/2017 9:19:41 AM By: Evlyn Kanner MD, FACS Entered By: Evlyn Kanner on 11/30/2017 09:19:40

## 2017-12-01 NOTE — Progress Notes (Signed)
Ave FilterCHANDLER, Charels L. ([id number]) Visit Report for 11/30/2017 Abuse/Suicide Risk Screen Details Patient Name: Stephen [name], [name] Date of Service: 11/30/2017 8:00 AM Medical Record Number: [id number] Patient Account Number: [id number] Date of Birth/Sex: 1961/10/25 58(56 y.o. Male) Treating RN: Curtis Sitesorthy, Joanna Primary Care Rosamund Nyland: Karie FetchAYCOCK, NGWE Other Clinician: Referring Money Mckeithan: Karie FetchAYCOCK, NGWE Treating Ryleigh Buenger/Extender: Rudene ReBritto, Errol Weeks in Treatment: 0 Abuse/Suicide Risk Screen Items Answer ABUSE/SUICIDE RISK SCREEN: Has anyone close to you tried to hurt or harm you recentlyo No Do you feel uncomfortable with anyone in your familyo No Has anyone forced you do things that you didnot want to doo No Do you have any thoughts of harming yourselfo No Patient displays signs or symptoms of abuse and/or neglect. No Electronic Signature(s) Signed: 11/30/2017 4:29:35 PM By: Curtis Sitesorthy, Joanna Entered By: Curtis Sitesorthy, Joanna on 11/30/2017 08:30:17 Eckhardt, Glenford L. ([id number]) -------------------------------------------------------------------------------- Activities of Daily Living Details Patient Name: Stephen [name], [name] Date of Service: 11/30/2017 8:00 AM Medical Record Number: [id number] Patient Account Number: [id number] Date of Birth/Sex: 1961/10/25 48(56 y.o. Male) Treating RN: Curtis Sitesorthy, Joanna Primary Care Dailynn Nancarrow: Karie FetchAYCOCK, NGWE Other Clinician: Referring Nashya Garlington: Karie FetchAYCOCK, NGWE Treating Quiana Cobaugh/Extender: Rudene ReBritto, Errol Weeks in Treatment: 0 Activities of Daily Living Items Answer Activities of Daily Living (Please select one for each item) Drive Automobile Completely Able Take Medications Completely Able Use Telephone Completely Able Care for Appearance Completely Able Use Toilet Completely Able Bath / Shower Completely Able Dress Self Completely Able Feed Self Completely Able Walk Completely Able Get In / Out Bed Completely Able Housework Completely Able Prepare Meals Completely  Able Handle Money Completely Able Shop for Self Completely Able Electronic Signature(s) Signed: 11/30/2017 4:29:35 PM By: Curtis Sitesorthy, Joanna Entered By: Curtis Sitesorthy, Joanna on 11/30/2017 08:30:45 Chaudhary, Viet L. ([id number]) -------------------------------------------------------------------------------- Education Assessment Details Patient Name: Stephen [name], [name] Date of Service: 11/30/2017 8:00 AM Medical Record Number: [id number] Patient Account Number: [id number] Date of Birth/Sex: 1961/10/25 [age] y.o. Male) Treating RN: Curtis Sitesorthy, Joanna Primary Care Jenee Spaugh: Karie FetchAYCOCK, NGWE Other Clinician: Referring Mariangela Heldt: Karie FetchAYCOCK, NGWE Treating Joshwa Hemric/Extender: Rudene ReBritto, Errol Weeks in Treatment: 0 Primary Learner Assessed: Patient Learning Preferences/Education Level/Primary Language Learning Preference: Explanation, Demonstration Highest Education Level: High School Preferred Language: English Cognitive Barrier Assessment/Beliefs Language Barrier: No Translator Needed: No Memory Deficit: No Emotional Barrier: No Cultural/Religious Beliefs Affecting Medical Care: No Physical Barrier Assessment Impaired Vision: No Impaired Hearing: No Decreased Hand dexterity: No Knowledge/Comprehension Assessment Knowledge Level: Medium Comprehension Level: Medium Ability to understand written Medium instructions: Ability to understand verbal Medium instructions: Motivation Assessment Anxiety Level: Calm Cooperation: Cooperative Education Importance: Acknowledges Need Interest in Health Problems: Asks Questions Perception: Coherent Willingness to Engage in Self- Medium Management Activities: Readiness to Engage in Self- Medium Management Activities: Electronic Signature(s) Signed: 11/30/2017 4:29:35 PM By: Curtis Sitesorthy, Joanna Entered By: Curtis Sitesorthy, Joanna on 11/30/2017 08:31:06 Rudell, Srinivas L. ([id number]) -------------------------------------------------------------------------------- Fall  Risk Assessment Details Patient Name: Stephen [name], [name] Date of Service: 11/30/2017 8:00 AM Medical Record Number: [id number] Patient Account Number: [id number] Date of Birth/Sex: 1961/10/25 [age] y.o. Male) Treating RN: Curtis Sitesorthy, Joanna Primary Care Yalissa Fink: Karie FetchAYCOCK, NGWE Other Clinician: Referring Batina Dougan: Karie FetchAYCOCK, NGWE Treating Takelia Urieta/Extender: Rudene ReBritto, Errol Weeks in Treatment: 0 Fall Risk Assessment Items Have you had 2 or more falls in the last 12 monthso 0 No Have you had any fall that resulted in injury in the last 12 monthso 0 No FALL RISK ASSESSMENT: History of falling - immediate or within 3 months 0 No Secondary diagnosis 0 No Ambulatory aid None/bed rest/wheelchair/nurse 0 Yes Crutches/cane/walker 0 No Furniture 0 No IV Access/Saline Lock  0 No Gait/Training Normal/bed rest/immobile 0 Yes Weak 0 No Impaired 0 No Mental Status Oriented to own ability 0 Yes Electronic Signature(s) Signed: 11/30/2017 4:29:35 PM By: Curtis Sitesorthy, Joanna Entered By: Curtis Sitesorthy, Joanna on 11/30/2017 08:31:13 Beougher, Semisi L. ([id number]) -------------------------------------------------------------------------------- Nutrition Risk Assessment Details Patient Name: Stephen [name], [name] Date of Service: 11/30/2017 8:00 AM Medical Record Number: [id number] Patient Account Number: [id number] Date of Birth/Sex: Jan 28, 1961 [age] y.o. Male) Treating RN: Curtis Sitesorthy, Joanna Primary Care Temica Righetti: Karie FetchAYCOCK, NGWE Other Clinician: Referring Emersen Carroll: Karie FetchAYCOCK, NGWE Treating Caspar Favila/Extender: Rudene ReBritto, Errol Weeks in Treatment: 0 Height (in): 68 Weight (lbs): 196 Body Mass Index (BMI): 29.8 Nutrition Risk Assessment Items NUTRITION RISK SCREEN: I have an illness or condition that made me change the kind and/or amount of 0 No food I eat I eat fewer than two meals per day 0 No I eat few fruits and vegetables, or milk products 0 No I have three or more drinks of beer, liquor or wine almost every day 0 No I have  tooth or mouth problems that make it hard for me to eat 0 No I don't always have enough money to buy the food I need 0 No I eat alone most of the time 0 No I take three or more different prescribed or over-the-counter drugs a day 0 No Without wanting to, I have lost or gained 10 pounds in the last six months 0 No I am not always physically able to shop, cook and/or feed myself 0 No Nutrition Protocols Good Risk Protocol 0 No interventions needed Moderate Risk Protocol Electronic Signature(s) Signed: 11/30/2017 4:29:35 PM By: Curtis Sitesorthy, Joanna Entered By: Curtis Sitesorthy, Joanna on 11/30/2017 08:31:20

## 2017-12-02 NOTE — Progress Notes (Signed)
Stephen [name], Rankin L. ([id number]) Visit Report for 11/30/2017 Allergy List Details Patient Name: Stephen [name], [name] Date of Service: 11/30/2017 8:00 AM Medical Record Number: [id number] Patient Account Number: [id number] Date of Birth/Sex: 1961-11-22 [age] y.o. Stephen [name]) Treating RN: Curtis Sitesorthy, Joanna Primary Care Tommie Dejoseph: Karie FetchAYCOCK, NGWE Other Clinician: Referring Camala Talwar: Karie FetchAYCOCK, NGWE Treating Valincia Touch/Extender: Rudene ReBritto, Errol Weeks in Treatment: 0 Allergies Active Allergies No Known Allergies Allergy Notes Electronic Signature(s) Signed: 11/30/2017 4:29:35 PM By: Curtis Sitesorthy, Joanna Entered By: Curtis Sitesorthy, Joanna on 11/30/2017 08:26:15 Kaley, Len L. ([id number]) -------------------------------------------------------------------------------- Arrival Information Details Patient Name: Stephen [name], [name] Date of Service: 11/30/2017 8:00 AM Medical Record Number: [id number] Patient Account Number: [id number] Date of Birth/Sex: 1961-11-22 [age] y.o. Stephen [name]) Treating RN: Curtis Sitesorthy, Joanna Primary Care Xhaiden Coombs: Karie FetchAYCOCK, NGWE Other Clinician: Referring Lalia Loudon: Karie FetchAYCOCK, NGWE Treating Jevonte Clanton/Extender: Rudene ReBritto, Errol Weeks in Treatment: 0 Visit Information Patient Arrived: Ambulatory Arrival Time: 08:22 Accompanied By: self Transfer Assistance: None Patient Identification Verified: Yes Secondary Verification Process Completed: Yes Patient Has Alerts: Yes Patient Alerts: DMII History Since Last Visit Added or deleted any medications: No Any new allergies or adverse reactions: No Had a fall or experienced change in activities of daily living that may affect risk of falls: No Signs or symptoms of abuse/neglect since last visito No Hospitalized since last visit: No Electronic Signature(s) Signed: 11/30/2017 4:29:35 PM By: Curtis Sitesorthy, Joanna Entered By: Curtis Sitesorthy, Joanna on 11/30/2017 08:23:26 Ledet, Dardan L.  ([id number]) -------------------------------------------------------------------------------- Clinic Level of Care Assessment Details Patient Name: Stephen [name], [name] Date of Service: 11/30/2017 8:00 AM Medical Record Number: [id number] Patient Account Number: [id number] Date of Birth/Sex: 1961-11-22 [age] y.o. Stephen [name]) Treating RN: Curtis Sitesorthy, Joanna Primary Care Aicha Clingenpeel: Karie FetchAYCOCK, NGWE Other Clinician: Referring Arhan Mcmanamon: Karie FetchAYCOCK, NGWE Treating Lovie Agresta/Extender: Rudene ReBritto, Errol Weeks in Treatment: 0 Clinic Level of Care Assessment Items TOOL 2 Quantity Score []  - Use when only an EandM is performed on the INITIAL visit 0 ASSESSMENTS - Nursing Assessment / Reassessment X - General Physical Exam (combine w/ comprehensive assessment (listed just below) when 1 20 performed on new pt. evals) X- 1 25 Comprehensive Assessment (HX, ROS, Risk Assessments, Wounds Hx, etc.) ASSESSMENTS - Wound and Skin Assessment / Reassessment X - Simple Wound Assessment / Reassessment - one wound 1 5 []  - 0 Complex Wound Assessment / Reassessment - multiple wounds []  - 0 Dermatologic / Skin Assessment (not related to wound area) ASSESSMENTS - Ostomy and/or Continence Assessment and Care []  - Incontinence Assessment and Management 0 []  - 0 Ostomy Care Assessment and Management (repouching, etc.) PROCESS - Coordination of Care X - Simple Patient / Family Education for ongoing care 1 15 []  - 0 Complex (extensive) Patient / Family Education for ongoing care []  - 0 Staff obtains ChiropractorConsents, Records, Test Results / Process Orders []  - 0 Staff telephones HHA, Nursing Homes / Clarify orders / etc []  - 0 Routine Transfer to another Facility (non-emergent condition) []  - 0 Routine Hospital Admission (non-emergent condition) X- 1 15 New Admissions / Manufacturing engineernsurance Authorizations / Ordering NPWT, Apligraf, etc. []  - 0 Emergency Hospital Admission (emergent condition) X- 1 10 Simple Discharge Coordination []  - 0 Complex  (extensive) Discharge Coordination PROCESS - Special Needs []  - Pediatric / Minor Patient Management 0 []  - 0 Isolation Patient Management Pepper, Joesph L. ([id number]) []  - 0 Hearing / Language / Visual special needs []  - 0 Assessment of Community assistance (transportation, D/C planning, etc.) []  - 0 Additional assistance / Altered mentation []  - 0 Support Surface(s) Assessment (bed, cushion, seat, etc.) INTERVENTIONS - Wound Cleansing /  Measurement X - Wound Imaging (photographs - any number of wounds) 1 5 []  - 0 Wound Tracing (instead of photographs) X- 1 5 Simple Wound Measurement - one wound []  - 0 Complex Wound Measurement - multiple wounds X- 1 5 Simple Wound Cleansing - one wound []  - 0 Complex Wound Cleansing - multiple wounds INTERVENTIONS - Wound Dressings X - Small Wound Dressing one or multiple wounds 1 10 []  - 0 Medium Wound Dressing one or multiple wounds []  - 0 Large Wound Dressing one or multiple wounds []  - 0 Application of Medications - injection INTERVENTIONS - Miscellaneous []  - External ear exam 0 []  - 0 Specimen Collection (cultures, biopsies, blood, body fluids, etc.) []  - 0 Specimen(s) / Culture(s) sent or taken to Lab for analysis []  - 0 Patient Transfer (multiple staff / Nurse, adult / Similar devices) []  - 0 Simple Staple / Suture removal (25 or less) []  - 0 Complex Staple / Suture removal (26 or more) []  - 0 Hypo / Hyperglycemic Management (close monitor of Blood Glucose) []  - 0 Ankle / Brachial Index (ABI) - do not check if billed separately Has the patient been seen at the hospital within the last three years: Yes Total Score: 115 Level Of Care: New/Established - Level 3 Electronic Signature(s) Signed: 11/30/2017 4:29:35 PM By: Curtis Sites Entered By: Curtis Sites on 11/30/2017 08:43:47 Peppard, Lashan L. ([id number]) -------------------------------------------------------------------------------- Encounter Discharge  Information Details Patient Name: Stephen [name] Date of Service: 11/30/2017 8:00 AM Medical Record Number: [id number] Patient Account Number: [id number] Date of Birth/Sex: 04-Aug-1961 (57 y.o. Stephen [name]) Treating RN: Curtis Sites Primary Care Marlis Oldaker: Karie Fetch Other Clinician: Referring Avalene Sealy: Karie Fetch Treating Rickardo Brinegar/Extender: Rudene Re in Treatment: 0 Encounter Discharge Information Items Discharge Pain Level: 0 Discharge Condition: Stable Ambulatory Status: Ambulatory Discharge Destination: Home Transportation: Private Auto Accompanied By: self Schedule Follow-up Appointment: Yes Medication Reconciliation completed and No provided to Patient/Care Vonnetta Akey: Provided on Clinical Summary of Care: 11/30/2017 Form Type Recipient Paper Patient JC Electronic Signature(s) Signed: 12/01/2017 2:37:55 PM By: Gwenlyn Perking Entered By: Gwenlyn Perking on 11/30/2017 08:56:04 Knighton, Romaine L. ([id number]) -------------------------------------------------------------------------------- Multi Wound Chart Details Patient Name: Stephen [name] Date of Service: 11/30/2017 8:00 AM Medical Record Number: [id number] Patient Account Number: [id number] Date of Birth/Sex: 1961/11/17 (56 y.o. Stephen [name]) Treating RN: Curtis Sites Primary Care Virgen Belland: Karie Fetch Other Clinician: Referring Aithana Kushner: Karie Fetch Treating Jaccob Czaplicki/Extender: Rudene Re in Treatment: 0 Vital Signs Height(in): 68 Pulse(bpm): 76 Weight(lbs): 196 Blood Pressure(mmHg): 133/73 Body Mass Index(BMI): 30 Temperature(F): 97.8 Respiratory Rate 18 (breaths/min): Photos: [N/A:N/A] Wound Location: Left Abdomen - Lower N/A N/A Quadrant Wounding Event: Gradually Appeared N/A N/A Primary Etiology: To be determined N/A N/A Comorbid History: Chronic Obstructive N/A N/A Pulmonary Disease (COPD), Type II Diabetes Date Acquired: 10/09/2017 N/A N/A Weeks of Treatment: 0 N/A N/A Wound  Status: Open N/A N/A Measurements L x W x D 0.3x0.3x0.1 N/A N/A (cm) Area (cm) : 0.071 N/A N/A Volume (cm) : 0.007 N/A N/A Classification: Full Thickness Without N/A N/A Exposed Support Structures Exudate Amount: Large N/A N/A Exudate Type: Serous N/A N/A Exudate Color: amber N/A N/A Wound Margin: Flat and Intact N/A N/A Granulation Amount: Large (67-100%) N/A N/A Granulation Quality: Red N/A N/A Necrotic Amount: None Present (0%) N/A N/A Exposed Structures: Fascia: No N/A N/A Fat Layer (Subcutaneous Tissue) Exposed: No Tendon: No Muscle: No Kerman, Hansel L. ([id number]) Joint: No Bone: No Epithelialization: None N/A N/A Periwound Skin Texture: Scarring: Yes N/A N/A Excoriation: No Induration:  No Callus: No Crepitus: No Rash: No Periwound Skin Moisture: Maceration: No N/A N/A Dry/Scaly: No Periwound Skin Color: Atrophie Blanche: No N/A N/A Cyanosis: No Ecchymosis: No Erythema: No Hemosiderin Staining: No Mottled: No Pallor: No Rubor: No Temperature: No Abnormality N/A N/A Tenderness on Palpation: No N/A N/A Wound Preparation: Ulcer Cleansing: N/A N/A Rinsed/Irrigated with Saline Topical Anesthetic Applied: None Treatment Notes Wound #1 (Left Abdomen - Lower Quadrant) 1. Cleansed with: Clean wound with Normal Saline 4. Dressing Applied: Other dressing (specify in notes) 5. Secondary Dressing Applied Dry Gauze Notes silvercel Electronic Signature(s) Signed: 11/30/2017 9:15:04 AM By: Evlyn KannerBritto, Errol MD, FACS Entered By: Evlyn KannerBritto, Errol on 11/30/2017 09:15:04 Moradi, Ludwin LMarland Kitchen. ([id number]) -------------------------------------------------------------------------------- Multi-Disciplinary Care Plan Details Patient Name: Stephen [name], [name] Date of Service: 11/30/2017 8:00 AM Medical Record Number: [id number] Patient Account Number: [id number] Date of Birth/Sex: 05-Jul-1961 [age] y.o. Stephen [name]) Treating RN: Curtis Sitesorthy, Joanna Primary Care Murdis Flitton: Karie FetchAYCOCK,  NGWE Other Clinician: Referring Cheralyn Oliver: Karie FetchAYCOCK, NGWE Treating Cheria Sadiq/Extender: Rudene ReBritto, Errol Weeks in Treatment: 0 Active Inactive ` Orientation to the Wound Care Program Nursing Diagnoses: Knowledge deficit related to the wound healing center program Goals: Patient/caregiver will verbalize understanding of the Wound Healing Center Program Date Initiated: 11/30/2017 Target Resolution Date: 02/10/2018 Goal Status: Active Interventions: Provide education on orientation to the wound center Notes: ` Wound/Skin Impairment Nursing Diagnoses: Impaired tissue integrity Goals: Ulcer/skin breakdown will heal within 14 weeks Date Initiated: 11/30/2017 Target Resolution Date: 02/10/2018 Goal Status: Active Interventions: Assess patient/caregiver ability to obtain necessary supplies Assess patient/caregiver ability to perform ulcer/skin care regimen upon admission and as needed Assess ulceration(s) every visit Notes: Electronic Signature(s) Signed: 11/30/2017 4:29:35 PM By: Curtis Sitesorthy, Joanna Entered By: Curtis Sitesorthy, Joanna on 11/30/2017 08:42:50 Hayse, Kody L. ([id number]) -------------------------------------------------------------------------------- Pain Assessment Details Patient Name: Stephen [name], [name] Date of Service: 11/30/2017 8:00 AM Medical Record Number: [id number] Patient Account Number: [id number] Date of Birth/Sex: 05-Jul-1961 [age] y.o. Stephen [name]) Treating RN: Curtis Sitesorthy, Joanna Primary Care Jovan Colligan: Karie FetchAYCOCK, NGWE Other Clinician: Referring Trang Bouse: Karie FetchAYCOCK, NGWE Treating Markel Kurtenbach/Extender: Rudene ReBritto, Errol Weeks in Treatment: 0 Active Problems Location of Pain Severity and Description of Pain Patient Has Paino No Site Locations Pain Management and Medication Current Pain Management: Notes Topical or injectable lidocaine is offered to patient for acute pain when surgical debridement is performed. If needed, Patient is instructed to use over the counter pain medication for the  following 24-48 hours after debridement. Wound care MDs do not prescribed pain medications. Patient has chronic pain or uncontrolled pain. Patient has been instructed to make an appointment with their Primary Care Physician for pain management. Electronic Signature(s) Signed: 11/30/2017 4:29:35 PM By: Curtis Sitesorthy, Joanna Entered By: Curtis Sitesorthy, Joanna on 11/30/2017 08:23:34 Scheel, Ellias Elbert EwingsL. ([id number]) -------------------------------------------------------------------------------- Patient/Caregiver Education Details Patient Name: Stephen [name], [name] Date of Service: 11/30/2017 8:00 AM Medical Record Number: [id number] Patient Account Number: [id number] Date of Birth/Gender: 05-Jul-1961 [age] y.o. Stephen [name]) Treating RN: Curtis Sitesorthy, Joanna Primary Care Physician: Karie FetchAYCOCK, NGWE Other Clinician: Referring Physician: Karie FetchAYCOCK, NGWE Treating Physician/Extender: Rudene ReBritto, Errol Weeks in Treatment: 0 Education Assessment Education Provided To: Patient Education Topics Provided Wound/Skin Impairment: Handouts: Other: wound care as ordered Methods: Demonstration, Explain/Verbal Responses: State content correctly Electronic Signature(s) Signed: 11/30/2017 4:29:35 PM By: Curtis Sitesorthy, Joanna Entered By: Curtis Sitesorthy, Joanna on 11/30/2017 08:52:12 Stclair, Torence L. ([id number]) -------------------------------------------------------------------------------- Wound Assessment Details Patient Name: Stephen [name], [name] Date of Service: 11/30/2017 8:00 AM Medical Record Number: [id number] Patient Account Number: [id number] Date of Birth/Sex: 05-Jul-1961 [age] y.o. Stephen [name]) Treating RN: Curtis Sitesorthy, Joanna Primary Care Priscella Donna: Karie FetchAYCOCK, NGWE Other Clinician: Referring Aaren Atallah:  AYCOCK, NGWE Treating Raziah Funnell/Extender: Rudene Re in Treatment: 0 Wound Status Wound Number: 1 Primary To be determined Etiology: Wound Location: Left Abdomen - Lower Quadrant Wound Status: Open Wounding Event: Gradually Appeared Comorbid  Chronic Obstructive Pulmonary Disease Date Acquired: 10/09/2017 History: (COPD), Type II Diabetes Weeks Of Treatment: 0 Clustered Wound: No Photos Photo Uploaded By: Curtis Sites on 11/30/2017 09:01:40 Wound Measurements Length: (cm) 0.3 Width: (cm) 0.3 Depth: (cm) 0.1 Area: (cm) 0.071 Volume: (cm) 0.007 % Reduction in Area: % Reduction in Volume: Epithelialization: None Tunneling: No Undermining: No Wound Description Full Thickness Without Exposed Support Classification: Structures Wound Margin: Flat and Intact Exudate Large Amount: Exudate Type: Serous Exudate Color: amber Foul Odor After Cleansing: No Slough/Fibrino Yes Wound Bed Granulation Amount: Large (67-100%) Exposed Structure Granulation Quality: Red Fascia Exposed: No Necrotic Amount: None Present (0%) Fat Layer (Subcutaneous Tissue) Exposed: No Tendon Exposed: No Muscle Exposed: No Joint Exposed: No Bone Exposed: No Gravett, Stillman L. ([id number]) Periwound Skin Texture Texture Color No Abnormalities Noted: No No Abnormalities Noted: No Callus: No Atrophie Blanche: No Crepitus: No Cyanosis: No Excoriation: No Ecchymosis: No Induration: No Erythema: No Rash: No Hemosiderin Staining: No Scarring: Yes Mottled: No Pallor: No Moisture Rubor: No No Abnormalities Noted: No Dry / Scaly: No Temperature / Pain Maceration: No Temperature: No Abnormality Wound Preparation Ulcer Cleansing: Rinsed/Irrigated with Saline Topical Anesthetic Applied: None Treatment Notes Wound #1 (Left Abdomen - Lower Quadrant) 1. Cleansed with: Clean wound with Normal Saline 4. Dressing Applied: Other dressing (specify in notes) 5. Secondary Dressing Applied Dry Gauze Notes silvercel Electronic Signature(s) Signed: 11/30/2017 8:35:45 AM By: Curtis Sites Entered By: Curtis Sites on 11/30/2017 08:35:45 Kibble, Jerryl L.  ([id number]) -------------------------------------------------------------------------------- Vitals Details Patient Name: Stephen [name] Date of Service: 11/30/2017 8:00 AM Medical Record Number: [id number] Patient Account Number: [id number] Date of Birth/Sex: July 28, 1961 (56 y.o. Stephen [name]) Treating RN: Curtis Sites Primary Care Terrius Gentile: Karie Fetch Other Clinician: Referring Marcia Hartwell: Karie Fetch Treating Amaryllis Malmquist/Extender: Rudene Re in Treatment: 0 Vital Signs Time Taken: 08:23 Temperature (F): 97.8 Height (in): 68 Pulse (bpm): 76 Source: Measured Respiratory Rate (breaths/min): 18 Weight (lbs): 196 Blood Pressure (mmHg): 133/73 Source: Measured Reference Range: 80 - 120 mg / dl Body Mass Index (BMI): 29.8 Electronic Signature(s) Signed: 11/30/2017 4:29:35 PM By: Curtis Sites Entered By: Curtis Sites on 11/30/2017 08:25:20

## 2017-12-07 ENCOUNTER — Encounter: Payer: BLUE CROSS/BLUE SHIELD | Attending: Physician Assistant | Admitting: Physician Assistant

## 2017-12-07 DIAGNOSIS — S31104A Unspecified open wound of abdominal wall, left lower quadrant without penetration into peritoneal cavity, initial encounter: Secondary | ICD-10-CM | POA: Insufficient documentation

## 2017-12-07 DIAGNOSIS — X58XXXA Exposure to other specified factors, initial encounter: Secondary | ICD-10-CM | POA: Insufficient documentation

## 2017-12-07 DIAGNOSIS — J449 Chronic obstructive pulmonary disease, unspecified: Secondary | ICD-10-CM | POA: Diagnosis not present

## 2017-12-07 DIAGNOSIS — Z7984 Long term (current) use of oral hypoglycemic drugs: Secondary | ICD-10-CM | POA: Diagnosis not present

## 2017-12-07 DIAGNOSIS — E11622 Type 2 diabetes mellitus with other skin ulcer: Secondary | ICD-10-CM | POA: Diagnosis present

## 2017-12-07 DIAGNOSIS — F17218 Nicotine dependence, cigarettes, with other nicotine-induced disorders: Secondary | ICD-10-CM | POA: Diagnosis not present

## 2017-12-07 DIAGNOSIS — K435 Parastomal hernia without obstruction or  gangrene: Secondary | ICD-10-CM | POA: Diagnosis not present

## 2017-12-08 NOTE — Progress Notes (Signed)
Stephen [name] ([id number]) Visit Report for 12/07/2017 Chief Complaint Document Details Patient Name: Stephen [name], Stephen [name] Date of Service: 12/07/2017 11:00 AM Medical Record Number: [id number] Patient Account Number: [id number] Date of Birth/Sex: 12-26-1960 (57 yKempo. Male) Treating RN: Curtis Sites Primary Care Provider: Karie Fetch Other Clinician: Referring Provider: Karie Fetch Treating Provider/Extender: STONE III, Mansa Willers Weeks in Treatment: 1 Information Obtained from: Patient Chief Complaint ulcerated area around his left stoma Electronic Signature(s) Signed: 12/07/2017 6:11:24 PM By: Lenda Kelp PA-C Entered By: Lenda Kelp on 12/07/2017 11:29:21 Gulledge, Kwesi L. ([id number]) -------------------------------------------------------------------------------- HPI Details Patient Name: Stephen [name] Date of Service: 12/07/2017 11:00 AM Medical Record Number: [id number] Patient Account Number: [id number] Date of Birth/Sex: 1961-08-06 (57 yKempo. 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 Severity: Patient states wound are getting worse. Duration: Patient has had the wound for > 3 months prior to seeking treatment at the wound center Timing: Pain in wound is Intermittent (comes and goes Context: The wound would happen gradually Modifying Factors: Consults to this date include:PCP had given him a course of antibiotics Associated Signs and Symptoms: his stoma is functioning well HPI Description: 57 year old gentleman who is known to be a diabetic and smokes 2 packs of cigarettes a day, was referred by his PCP for a peristomal ulceration which she's had for about 3 months.  The patient was treated with Bactrim for a while and says it got a little better. He was referred to as for further care. Past medical history significant for diabetes mellitus which is not controlled well and has hyperglycemia and his last hemoglobin A1c was 8[name]6%. He also has known to have penile cancer in 2006 and was seen in its [facility] is treated for COPD and has had a bowel perforation which was possibly perforated diverticulitis which was treated with surgery in March 2012. 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 I understand he was seen at [facility] by the general surgeons in 2013 and they told him to take care of his diabetes mellitus and stop smoking and come back for a review. The patient has not gone back for an opinion regarding this. Has noted he smokes 2 packets of cigarettes a day since the age of 64. 12/07/17 on evaluation today patient appears to be doing very well in regard to the ulceration wound in the region of his stoma superiorly. He has been tolerating the dressing changes without complication and has noted minimal drainage in recent weeks. He is having no discomfort which is good news. ====== Old notes [name] is a 57 year old male with history of diabetes and smoking who presents with skin irritation around his left lower quadrant ostomy site. Approximately 3 years ago he was in the hospital for a colon perforation which required a placement of an ostomy. Unfortunately for [name], he has continued to smoke which has precluded his surgeon from reversing his ostomy. He states that his blood sugars are in reasonable control at home. He was referred to the wound clinic because of skin irritation around his ostomy site. At present he did not have open wounds around the ostomy site. 2 weeks ago he saw a Careers adviser who  placed Duoderm around the ostomy itself which has helped some of the skin irritation.  He also bought a new ostomy appliance with less adhesive which also helped to starting skin irritation. He denies fever or chills at home. =========== Electronic Signature(s) Signed: 12/07/2017 6:11:24 PM By: Lenda Kelp PA-C Entered By: Lenda Kelp on 12/07/2017 11:38:10 Pursell, Rajvir L. ([id number]) -------------------------------------------------------------------------------- Physical Exam Details Patient Name: Stephen [name] Date of Service: 12/07/2017 11:00 AM Medical Record Number: [id number] Patient Account Number: [id number] Date of Birth/Sex: Mar 07, 1961 (56 yKempo. Male) Treating RN: Curtis Sites Primary Care Provider: Karie Fetch Other Clinician: Referring Provider: Karie Fetch Treating Provider/Extender: STONE III, Orlena Garmon Weeks in Treatment: 1 Constitutional Well-nourished and well-hydrated in no acute distress. Respiratory normal breathing without difficulty. clear to auscultation bilaterally. Cardiovascular regular rate and rhythm with normal S1, S2. Psychiatric this patient is able to make decisions and demonstrates good insight into disease process. Alert and Oriented x 3. pleasant and cooperative. Notes At this point the wound almost looks to be completely close to me it's dry and there does not appear to be any drainage in particular. With that being said patient continues to perform the dressing changes just to be sure. There is no evidence of infection. Electronic Signature(s) Signed: 12/07/2017 6:11:24 PM By: Lenda Kelp PA-C Entered By: Lenda Kelp on 12/07/2017 11:39:19 Kisiel, Doyt LMarland Kitchen ([id number]) -------------------------------------------------------------------------------- Physician Orders Details Patient Name: Stephen [name] Date of Service: 12/07/2017 11:00 AM Medical Record Number: [id number] Patient Account Number: [id number] Date of Birth/Sex: June 26, 1961 (57 yKempo. Male) Treating RN: Curtis Sites Primary Care Provider:  Karie Fetch Other Clinician: Referring Provider: Karie Fetch Treating Provider/Extender: Linwood Dibbles, Reianna Batdorf Weeks in Treatment: 1 Verbal / Phone Orders: No Diagnosis Coding ICD-10 Coding Code Description [name] Type 2 diabetes mellitus with other skin ulcer F17[name]218 Nicotine dependence, cigarettes, with other nicotine-induced disorders K43[name]5 Parastomal hernia without obstruction or gangrene Unspecified open wound of abdominal wall, left lower quadrant without penetration into peritoneal cavity, S31[name]104A initial encounter Wound Cleansing Wound #1 Left Abdomen - Lower Quadrant o Clean wound with Normal Saline. o May Shower, gently pat wound dry prior to applying new dressing. Primary Wound Dressing Wound #1 Left Abdomen - Lower Quadrant o Silvercel Non-Adherent Secondary Dressing Wound #1 Left Abdomen - Lower Quadrant o Dry Gauze Dressing Change Frequency Wound #1 Left Abdomen - Lower Quadrant o Other: - change as often as you need Follow-up Appointments o Return Appointment in 1 week. Additional Orders / Instructions Wound #1 Left Abdomen - Lower Quadrant o Stop Smoking - please try to quit smoking o Vitamin A; Vitamin C, Zinc - over the counter supplements o Increase protein intake. Electronic Signature(s) Signed: 12/07/2017 4:44:50 PM By: Curtis Sites Signed: 12/07/2017 6:11:24 PM By: Lenda Kelp PA-C Entered By: Curtis Sites on 12/07/2017 11:34:10 Wolven, Reynold L. ([id number]) -------------------------------------------------------------------------------- Problem List Details Patient Name: Stephen [name] Date of Service: 12/07/2017 11:00 AM Medical Record Number: [id number] Patient Account Number: [id number] Date of Birth/Sex: April 02, 1961 (57 yKempo. Male) Treating RN: Curtis Sites Primary Care Provider: Karie Fetch Other Clinician: Referring Provider: Karie Fetch Treating Provider/Extender: Linwood Dibbles, Umar Patmon Weeks in Treatment: 1 Active  Problems ICD-10 Encounter Code Description Active Date Diagnosis [name] Type 2 diabetes mellitus with other skin ulcer 11/30/2017 Yes F17[name]218 Nicotine dependence, cigarettes, with other nicotine-induced 11/30/2017 Yes disorders K43[name]5 Parastomal hernia without obstruction or gangrene 11/30/2017 Yes S31[name]104A Unspecified open wound of abdominal wall, left lower quadrant 11/30/2017 Yes without penetration into peritoneal cavity, initial encounter  Inactive Problems Resolved Problems Electronic Signature(s) Signed: 12/07/2017 6:11:24 PM By: Lenda Kelp PA-C Entered By: Lenda Kelp on 12/07/2017 11:29:00 Sleeth, Adyen L. ([id number]) -------------------------------------------------------------------------------- Progress Note Details Patient Name: Stephen [name] Date of Service: 12/07/2017 11:00 AM Medical Record Number: [id number] Patient Account Number: [id number] Date of Birth/Sex: 1961-07-29 (57 yKempo. Male) Treating RN: Curtis Sites Primary Care Provider: Karie Fetch Other Clinician: Referring Provider: Karie Fetch Treating Provider/Extender: Linwood Dibbles, Rashed Edler Weeks in Treatment: 1 Subjective Chief Complaint Information obtained from Patient ulcerated area around his left stoma History of Present Illness (HPI) The following HPI elements were documented for the patient's wound: Location: peristomal ulceration in the left lower quadrant Quality: Patient reports experiencing a dull pain to affected area(s). Severity: Patient states wound are getting worse. Duration: Patient has had the wound for > 3 months prior to seeking treatment at the wound center Timing: Pain in wound is Intermittent (comes and goes Context: The wound would happen gradually Modifying Factors: Consults to this date include:PCP had given him a course of antibiotics Associated Signs and Symptoms: his stoma is functioning well 57 year old gentleman who is known to be a diabetic and smokes 2 packs  of cigarettes a day, was referred by his PCP for a peristomal ulceration which she's had for about 3 months. The patient was treated with Bactrim for a while and says it got a little better. He was referred to as for further care. Past medical history significant for diabetes mellitus which is not controlled well and has hyperglycemia and his last hemoglobin A1c was 8[name]6%. He also has known to have penile cancer in 2006 and was seen in its [facility] is treated for COPD and has had a bowel perforation which was possibly perforated diverticulitis which was treated with surgery in March 2012. 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 I understand he was seen at [facility] by the general surgeons in 2013 and they told him to take care of his diabetes mellitus and stop smoking and come back for a review. The patient has not gone back for an opinion regarding this. Has noted he smokes 2 packets of cigarettes a day since the age of 73. 12/07/17 on evaluation today patient appears to be doing very well in regard to the ulceration wound in the region of his stoma superiorly. He has been tolerating the dressing changes without complication and has noted minimal drainage in recent weeks. He is having no discomfort which is good news. ====== Old notes Mr. Martine is a 57 year old male with history of diabetes and smoking who presents with skin irritation around his left lower quadrant ostomy site. Approximately 3 years ago he was in the hospital for a colon perforation which required a placement of an ostomy. Unfortunately for Mr. Mclaren, he has continued to smoke which has precluded his surgeon from reversing his ostomy. He states that his blood sugars are in reasonable control at home. He was referred to the wound clinic because of skin irritation around his ostomy site. At present he did not have open wounds around the ostomy site. 2 weeks ago  he saw a surgeon who placed Duoderm around the ostomy itself which has helped some of the skin irritation. He also bought a new ostomy appliance with less adhesive which also helped to starting skin irritation. He denies fever or chills at home. =========== Patient History Information obtained from Patient. Family  History Sansone, Mathis L. ([id number]) Diabetes - Father, Heart Disease - Father, Thyroid Problems - Child, No family history of Cancer, Hereditary Spherocytosis, Hypertension, Kidney Disease, Lung Disease, Seizures, Stroke, Tuberculosis. Social History Current every day smoker, Marital Status - Married, Alcohol Use - Never, Drug Use - No History, Caffeine Use - Daily. Medical And Surgical History Notes Gastrointestinal colostomy r/t diverticulitis 2012 Review of Systems (ROS) Constitutional Symptoms (General Health) Denies complaints or symptoms of Fever, Chills. Respiratory The patient has no complaints or symptoms. Cardiovascular The patient has no complaints or symptoms. Psychiatric The patient has no complaints or symptoms. Objective Constitutional Well-nourished and well-hydrated in no acute distress. Vitals Time Taken: 11:25 AM, Height: 68 in, Weight: 196 lbs, BMI: 29[name]8, Temperature: 98[name]4 F, Pulse: 70 bpm, Respiratory Rate: 18 breaths/min, Blood Pressure: 133/74 mmHg. Respiratory normal breathing without difficulty. clear to auscultation bilaterally. Cardiovascular regular rate and rhythm with normal S1, S2. Psychiatric this patient is able to make decisions and demonstrates good insight into disease process. Alert and Oriented x 3. pleasant and cooperative. General Notes: At this point the wound almost looks to be completely close to me it's dry and there does not appear to be any drainage in particular. With that being said patient continues to perform the dressing changes just to be sure. There is no evidence of infection. Integumentary (Hair,  Skin) Wound #1 status is Open. Original cause of wound was Gradually Appeared. The wound is located on the Left Abdomen - Lower Quadrant. The wound measures 0[name]1cm length x 0[name]1cm width x 0[name]1cm depth; 0[name]008cm^2 area and 0[name]001cm^3 volume. There is no tunneling or undermining noted. There is a medium amount of serous drainage noted. The wound margin is flat and intact. There is large (67-100%) red granulation within the wound bed. There is no necrotic tissue within the wound bed. The periwound skin appearance exhibited: Scarring. The periwound skin appearance did not exhibit: Callus, Crepitus, Beattie, Haward L. ([id number]) Excoriation, Induration, Rash, Dry/Scaly, Maceration, Atrophie Blanche, Cyanosis, Ecchymosis, Hemosiderin Staining, Mottled, Pallor, Rubor, Erythema. Periwound temperature was noted as No Abnormality. Assessment Active Problems ICD-10 E11[name]622 - Type 2 diabetes mellitus with other skin ulcer F17[name]218 - Nicotine dependence, cigarettes, with other nicotine-induced disorders K43[name]5 - Parastomal hernia without obstruction or gangrene S31[name]104A - Unspecified open wound of abdominal wall, left lower quadrant without penetration into peritoneal cavity, initial encounter Plan Wound Cleansing: Wound #1 Left Abdomen - Lower Quadrant: Clean wound with Normal Saline. May Shower, gently pat wound dry prior to applying new dressing. Primary Wound Dressing: Wound #1 Left Abdomen - Lower Quadrant: Silvercel Non-Adherent Secondary Dressing: Wound #1 Left Abdomen - Lower Quadrant: Dry Gauze Dressing Change Frequency: Wound #1 Left Abdomen - Lower Quadrant: Other: - change as often as you need Follow-up Appointments: Return Appointment in 1 week. Additional Orders / Instructions: Wound #1 Left Abdomen - Lower Quadrant: Stop Smoking - please try to quit smoking Vitamin A; Vitamin C, Zinc - over the counter supplements Increase protein intake. At this point the wound almost looks to be  completely close to me it's dry and there does not appear to be any drainage in particular. With that being said patient continues to perform the dressing changes just to be sure. There is no evidence of infection. Electronic Signature(s) SUESS, Shamir L. ([id number]) Signed: 12/07/2017 6:11:24 PM By: Lenda Kelp PA-C Entered By: Lenda Kelp on 12/07/2017 11:39:37 Goytia, Edan L. ([id number]) -------------------------------------------------------------------------------- ROS/PFSH Details Patient Name: Stephen [name] Date of Service: 12/07/2017 11:00 AM Medical  Record Number: [id number] Patient Account Number: [id number] Date of Birth/Sex: [birth year] (57 yKempo. Male) Treating RN: Curtis Sites Primary Care Provider: Karie Fetch Other Clinician: Referring Provider: Karie Fetch Treating Provider/Extender: STONE III, Andrewjames Weirauch Weeks in Treatment: 1 Information Obtained From Patient Wound History Do you currently have one or more open woundso Yes How many open wounds do you currently haveo 1 Approximately how long have you had your woundso 2 months How have you been treating your wound(s) until nowo duoderm Has your wound(s) ever healed and then re-openedo No Have you had any lab work done in the past montho Yes Who ordered the lab work doneo PCP Have you tested positive for an antibiotic resistant organism (MRSA, VRE)o Yes Date: 12/05/2012 Have you tested positive for osteomyelitis (bone infection)o No Have you had any tests for circulation on your legso No Constitutional Symptoms (General Health) Complaints and Symptoms: Negative for: Fever; Chills Hematologic/Lymphatic Medical History: Negative for: Anemia; Hemophilia; Human Immunodeficiency Virus; Lymphedema; Sickle Cell Disease Respiratory Complaints and Symptoms: No Complaints or Symptoms Medical History: Positive for: Chronic Obstructive Pulmonary Disease (COPD) Negative for: Aspiration; Asthma; Pneumothorax;  Sleep Apnea; Tuberculosis Cardiovascular Complaints and Symptoms: No Complaints or Symptoms Medical History: Negative for: Angina; Arrhythmia; Congestive Heart Failure; Coronary Artery Disease; Deep Vein Thrombosis; Hypertension; Hypotension; Myocardial Infarction; Peripheral Arterial Disease; Peripheral Venous Disease; Phlebitis; Vasculitis Gastrointestinal Medical History: Negative for: Cirrhosis ; Colitis; Crohnos; Hepatitis A; Hepatitis B; Hepatitis C Past Medical History Notes: Beahm, Smayan L. ([id number]) colostomy r/t diverticulitis 2012 Endocrine Medical History: Positive for: Type II Diabetes Negative for: Type I Diabetes Time with diabetes: 6 years Treated with: Oral agents Blood sugar tested every day: No Immunological Medical History: Negative for: Lupus Erythematosus; Raynaudos; Scleroderma Integumentary (Skin) Medical History: Negative for: History of Burn; History of pressure wounds Musculoskeletal Medical History: Negative for: Gout; Rheumatoid Arthritis; Osteoarthritis; Osteomyelitis Neurologic Medical History: Negative for: Dementia; Neuropathy; Quadriplegia; Paraplegia; Seizure Disorder Oncologic Medical History: Negative for: Received Chemotherapy; Received Radiation Psychiatric Complaints and Symptoms: No Complaints or Symptoms Medical History: Negative for: Anorexia/bulimia; Confinement Anxiety Immunizations Pneumococcal Vaccine: Received Pneumococcal Vaccination: No Implantable Devices Family and Social History Cancer: No; Diabetes: Yes - Father; Heart Disease: Yes - Father; Hereditary Spherocytosis: No; Hypertension: No; Kidney Disease: No; Lung Disease: No; Seizures: No; Stroke: No; Thyroid Problems: Yes - Child; Tuberculosis: No; Current every day smoker; Marital Status - Married; Alcohol Use: Never; Drug Use: No History; Caffeine Use: Daily; Financial Concerns: Yes; Food, Clothing or Shelter Needs: No; Support System Lacking: No;  Transportation Concerns: No; Advanced Directives: No; Patient does not want information on Advanced Directives; Do not resuscitate: No; Living Will: No; Medical Power of Attorney: No Briseno, Ondra L. ([id number]) Physician Affirmation I have reviewed and agree with the above information. Electronic Signature(s) Signed: 12/07/2017 4:44:50 PM By: Curtis Sites Signed: 12/07/2017 6:11:24 PM By: Lenda Kelp PA-C Entered By: Lenda Kelp on 12/07/2017 11:38:54 Lasseter, Khaliq L. ([id number]) -------------------------------------------------------------------------------- SuperBill Details Patient Name: Stephen [name] Date of Service: 12/07/2017 Medical Record Number: [id number] Patient Account Number: [id number] Date of Birth/Sex: 1961-06-20 (57 yKempo. Male) Treating RN: Curtis Sites Primary Care Provider: Karie Fetch Other Clinician: Referring Provider: Karie Fetch Treating Provider/Extender: Linwood Dibbles, Chaka Boyson Weeks in Treatment: 1 Diagnosis Coding ICD-10 Codes Code Description [name] Type 2 diabetes mellitus with other skin ulcer F17[name]218 Nicotine dependence, cigarettes, with other nicotine-induced disorders K43[name]5 Parastomal hernia without obstruction or gangrene Unspecified open wound of abdominal wall, left lower quadrant without penetration into peritoneal cavity, S31[name]104A initial encounter  Facility Procedures CPT4 Code: [id number] Description: [id number] - WOUND CARE VISIT-LEV 2 EST PT Modifier: Quantity: 1 Physician Procedures CPT4: Description Modifier Quantity Code [id number] - WC PHYS LEVEL 3 - EST PT 1 ICD-10 Diagnosis Description [name] Type 2 diabetes mellitus with other skin ulcer F17[name]218 Nicotine dependence, cigarettes, with other nicotine-induced disorders K43[name]5  Parastomal hernia without obstruction or gangrene S31[name]104A Unspecified open wound of abdominal wall, left lower quadrant without penetration into peritoneal cavity, initial encounter Electronic  Signature(s) Signed: 12/07/2017 12:00:19 PM By: Curtis Sitesorthy, Joanna Signed: 12/07/2017 6:11:24 PM By: Lenda KelpStone III, Griffin Gerrard PA-C Entered By: Curtis Sitesorthy, Joanna on 12/07/2017 12:00:19

## 2017-12-08 NOTE — Progress Notes (Signed)
XSAVIER, SEELEY ([id number]) Visit Report for 12/07/2017 Arrival Information Details Patient Name: Stephen [name], Stephen [name] Date of Service: 12/07/2017 11:00 AM Medical Record Number: [id number] Patient Account Number: [id number] Date of Birth/Sex: 1961/02/22 (57 y.o. Male) Treating RN: Curtis Sites Primary Care Mahesh Sizemore: Karie Fetch Other Clinician: Referring Jaston Havens: Karie Fetch Treating Aahana Elza/Extender: STONE III, HOYT Weeks in Treatment: 1 Visit Information History Since Last Visit Added or deleted any medications: No Patient Arrived: Ambulatory Any new allergies or adverse reactions: No Arrival Time: 11:21 Had a fall or experienced change in No Accompanied By: self activities of daily living that may affect Transfer Assistance: None risk of falls: Patient Identification Verified: Yes Signs or symptoms of abuse/neglect since last visito No Secondary Verification Process Completed: Yes Hospitalized since last visit: No Patient Has Alerts: Yes Has Dressing in Place as Prescribed: Yes Patient Alerts: DMII Pain Present Now: No Electronic Signature(s) Signed: 12/07/2017 4:44:50 PM By: Curtis Sites Entered By: Curtis Sites on 12/07/2017 11:22:07 [name], [name] ([id number]) -------------------------------------------------------------------------------- Clinic Level of Care Assessment Details Patient Name: Stephen [name] Date of Service: 12/07/2017 11:00 AM Medical Record Number: [id number] Patient Account Number: [id number] Date of Birth/Sex: [birth year] (57 y.o. Male) Treating RN: Curtis Sites Primary Care Noya Santarelli: Karie Fetch Other Clinician: Referring Jodel Mayhall: Karie Fetch Treating Rylyn Zawistowski/Extender: STONE III, HOYT Weeks in Treatment: 1 Clinic Level of Care Assessment Items TOOL 4 Quantity Score []  - Use when only an EandM is performed on FOLLOW-UP visit 0 ASSESSMENTS - Nursing Assessment / Reassessment X - Reassessment of Co-morbidities (includes  updates in patient status) 1 10 X- 1 5 Reassessment of Adherence to Treatment Plan ASSESSMENTS - Wound and Skin Assessment / Reassessment X - Simple Wound Assessment / Reassessment - one wound 1 5 []  - 0 Complex Wound Assessment / Reassessment - multiple wounds []  - 0 Dermatologic / Skin Assessment (not related to wound area) ASSESSMENTS - Focused Assessment []  - Circumferential Edema Measurements - multi extremities 0 []  - 0 Nutritional Assessment / Counseling / Intervention []  - 0 Lower Extremity Assessment (monofilament, tuning fork, pulses) []  - 0 Peripheral Arterial Disease Assessment (using hand held doppler) ASSESSMENTS - Ostomy and/or Continence Assessment and Care []  - Incontinence Assessment and Management 0 []  - 0 Ostomy Care Assessment and Management (repouching, etc.) PROCESS - Coordination of Care X - Simple Patient / Family Education for ongoing care 1 15 []  - 0 Complex (extensive) Patient / Family Education for ongoing care []  - 0 Staff obtains Chiropractor, Records, Test Results / Process Orders []  - 0 Staff telephones HHA, Nursing Homes / Clarify orders / etc []  - 0 Routine Transfer to another Facility (non-emergent condition) []  - 0 Routine Hospital Admission (non-emergent condition) []  - 0 New Admissions / Manufacturing engineer / Ordering NPWT, Apligraf, etc. []  - 0 Emergency Hospital Admission (emergent condition) X- 1 10 Simple Discharge Coordination Goyal, Darden L. ([id number]) []  - 0 Complex (extensive) Discharge Coordination PROCESS - Special Needs []  - Pediatric / Minor Patient Management 0 []  - 0 Isolation Patient Management []  - 0 Hearing / Language / Visual special needs []  - 0 Assessment of Community assistance (transportation, D/C planning, etc.) []  - 0 Additional assistance / Altered mentation []  - 0 Support Surface(s) Assessment (bed, cushion, seat, etc.) INTERVENTIONS - Wound Cleansing / Measurement X - Simple Wound Cleansing  - one wound 1 5 []  - 0 Complex Wound Cleansing - multiple wounds X- 1 5 Wound Imaging (photographs - any number of wounds) []  - 0 Wound Tracing (instead  of photographs) X- 1 5 Simple Wound Measurement - one wound []  - 0 Complex Wound Measurement - multiple wounds INTERVENTIONS - Wound Dressings X - Small Wound Dressing one or multiple wounds 1 10 []  - 0 Medium Wound Dressing one or multiple wounds []  - 0 Large Wound Dressing one or multiple wounds []  - 0 Application of Medications - topical []  - 0 Application of Medications - injection INTERVENTIONS - Miscellaneous []  - External ear exam 0 []  - 0 Specimen Collection (cultures, biopsies, blood, body fluids, etc.) []  - 0 Specimen(s) / Culture(s) sent or taken to Lab for analysis []  - 0 Patient Transfer (multiple staff / Nurse, adultHoyer Lift / Similar devices) []  - 0 Simple Staple / Suture removal (25 or less) []  - 0 Complex Staple / Suture removal (26 or more) []  - 0 Hypo / Hyperglycemic Management (close monitor of Blood Glucose) []  - 0 Ankle / Brachial Index (ABI) - do not check if billed separately X- 1 5 Vital Signs [name], [name] ([id number]) Has the patient been seen at the hospital within the last three years: Yes Total Score: 75 Level Of Care: New/Established - Level 2 Electronic Signature(s) Signed: 12/07/2017 4:44:50 PM By: Curtis Sitesorthy, Joanna Entered By: Curtis Sitesorthy, Joanna on 12/07/2017 11:59:56 Standlee, Kainoa L. ([id number]) -------------------------------------------------------------------------------- Encounter Discharge Information Details Patient Name: Stephen MaisHANDLER, [name] Date of Service: 12/07/2017 11:00 AM Medical Record Number: [id number] Patient Account Number: [id number] Date of Birth/Sex: 1961-09-18 59(56 y.o. Male) Treating RN: Curtis Sitesorthy, Joanna Primary Care Betha Shadix: Karie FetchAYCOCK, NGWE Other Clinician: Referring Kennith Morss: Karie FetchAYCOCK, NGWE Treating Daundre Biel/Extender: Linwood DibblesSTONE III, HOYT Weeks in Treatment: 1 Encounter  Discharge Information Items Discharge Pain Level: 0 Discharge Condition: Stable Ambulatory Status: Ambulatory Discharge Destination: Home Transportation: Private Auto Accompanied By: self Schedule Follow-up Appointment: Yes Medication Reconciliation completed and No provided to Patient/Care Charlynn Salih: Provided on Clinical Summary of Care: 12/07/2017 Form Type Recipient Paper Patient JC Electronic Signature(s) Signed: 12/07/2017 12:03:32 PM By: Curtis Sitesorthy, Joanna Entered By: Curtis Sitesorthy, Joanna on 12/07/2017 12:03:32 Stephen [name], [name] ([id number]) -------------------------------------------------------------------------------- Multi Wound Chart Details Patient Name: Stephen MaisHANDLER, [name] Date of Service: 12/07/2017 11:00 AM Medical Record Number: [id number] Patient Account Number: [id number] Date of Birth/Sex: 1961-09-18 [age] y.o. Male) Treating RN: Curtis Sitesorthy, Joanna Primary Care Izamar Linden: Karie FetchAYCOCK, NGWE Other Clinician: Referring Arvel Oquinn: Karie FetchAYCOCK, NGWE Treating Derrick Tiegs/Extender: STONE III, HOYT Weeks in Treatment: 1 Vital Signs Height(in): 68 Pulse(bpm): 70 Weight(lbs): 196 Blood Pressure(mmHg): 133/74 Body Mass Index(BMI): 30 Temperature(F): 98.4 Respiratory Rate 18 (breaths/min): Photos: [1:No Photos] [N/A:N/A] Wound Location: [1:Left Abdomen - Lower Quadrant] [N/A:N/A] Wounding Event: [1:Gradually Appeared] [N/A:N/A] Primary Etiology: [1:To be determined] [N/A:N/A] Comorbid History: [1:Chronic Obstructive Pulmonary Disease (COPD), Type II Diabetes] [N/A:N/A] Date Acquired: [1:10/09/2017] [N/A:N/A] Weeks of Treatment: [1:1] [N/A:N/A] Wound Status: [1:Open] [N/A:N/A] Measurements L x W x D [1:0.1x0.1x0.1] [N/A:N/A] (cm) Area (cm) : [1:0.008] [N/A:N/A] Volume (cm) : [1:0.001] [N/A:N/A] % Reduction in Area: [1:88.70%] [N/A:N/A] % Reduction in Volume: [1:85.70%] [N/A:N/A] Classification: [1:Full Thickness Without Exposed Support Structures] [N/A:N/A] Exudate Amount: [1:Medium]  [N/A:N/A] Exudate Type: [1:Serous] [N/A:N/A] Exudate Color: [1:amber] [N/A:N/A] Wound Margin: [1:Flat and Intact] [N/A:N/A] Granulation Amount: [1:Large (67-100%)] [N/A:N/A] Granulation Quality: [1:Red] [N/A:N/A] Necrotic Amount: [1:None Present (0%)] [N/A:N/A] Exposed Structures: [1:Fascia: No Fat Layer (Subcutaneous Tissue) Exposed: No Tendon: No Muscle: No Joint: No Bone: No] [N/A:N/A] Epithelialization: [1:Medium (34-66%)] [N/A:N/A] Periwound Skin Texture: [1:Scarring: Yes Excoriation: No Induration: No] [N/A:N/A] Callus: No Crepitus: No Rash: No Periwound Skin Moisture: Maceration: No N/A N/A Dry/Scaly: No Periwound Skin Color: Atrophie Blanche: No N/A N/A Cyanosis: No Ecchymosis: No  Erythema: No Hemosiderin Staining: No Mottled: No Pallor: No Rubor: No Temperature: No Abnormality N/A N/A Tenderness on Palpation: No N/A N/A Wound Preparation: Ulcer Cleansing: N/A N/A Rinsed/Irrigated with Saline Topical Anesthetic Applied: None Treatment Notes Electronic Signature(s) Signed: 12/07/2017 4:44:50 PM By: Curtis Sites Entered By: Curtis Sites on 12/07/2017 11:31:29 Sanderlin, [name] ([id number]) -------------------------------------------------------------------------------- Multi-Disciplinary Care Plan Details Patient Name: Stephen [name] Date of Service: 12/07/2017 11:00 AM Medical Record Number: [id number] Patient Account Number: [id number] Date of Birth/Sex: 15-Jul-1961 (57 y.o. Male) Treating RN: Curtis Sites Primary Care Jalie Eiland: Karie Fetch Other Clinician: Referring Durenda Pechacek: Karie Fetch Treating Jess Toney/Extender: STONE III, HOYT Weeks in Treatment: 1 Active Inactive ` Orientation to the Wound Care Program Nursing Diagnoses: Knowledge deficit related to the wound healing center program Goals: Patient/caregiver will verbalize understanding of the Wound Healing Center Program Date Initiated: 11/30/2017 Target Resolution Date: 02/10/2018 Goal  Status: Active Interventions: Provide education on orientation to the wound center Notes: ` Wound/Skin Impairment Nursing Diagnoses: Impaired tissue integrity Goals: Ulcer/skin breakdown will heal within 14 weeks Date Initiated: 11/30/2017 Target Resolution Date: 02/10/2018 Goal Status: Active Interventions: Assess patient/caregiver ability to obtain necessary supplies Assess patient/caregiver ability to perform ulcer/skin care regimen upon admission and as needed Assess ulceration(s) every visit Notes: Electronic Signature(s) Signed: 12/07/2017 4:44:50 PM By: Curtis Sites Entered By: Curtis Sites on 12/07/2017 11:31:20 Engen, Hatem L. ([id number]) -------------------------------------------------------------------------------- Pain Assessment Details Patient Name: Stephen [name] Date of Service: 12/07/2017 11:00 AM Medical Record Number: [id number] Patient Account Number: [id number] Date of Birth/Sex: 07-22-1961 (58 y.o. Male) Treating RN: Curtis Sites Primary Care Ermagene Saidi: Karie Fetch Other Clinician: Referring Karem Farha: Karie Fetch Treating Aryel Edelen/Extender: STONE III, HOYT Weeks in Treatment: 1 Active Problems Location of Pain Severity and Description of Pain Patient Has Paino No Site Locations Pain Management and Medication Current Pain Management: Electronic Signature(s) Signed: 12/07/2017 4:44:50 PM By: Curtis Sites Entered By: Curtis Sites on 12/07/2017 11:22:17 [name], [name] ([id number]) -------------------------------------------------------------------------------- Patient/Caregiver Education Details Patient Name: Stephen [name] Date of Service: 12/07/2017 11:00 AM Medical Record Number: [id number] Patient Account Number: [id number] Date of Birth/Gender: 09/03/1961 (57 y.o. Male) Treating RN: Curtis Sites Primary Care Physician: Karie Fetch Other Clinician: Referring Physician: Karie Fetch Treating Physician/Extender: Linwood Dibbles,  HOYT Weeks in Treatment: 1 Education Assessment Education Provided To: Patient Education Topics Provided Wound/Skin Impairment: Handouts: Other: wound care as ordered Methods: Demonstration, Explain/Verbal Responses: State content correctly Electronic Signature(s) Signed: 12/07/2017 4:44:50 PM By: Curtis Sites Entered By: Curtis Sites on 12/07/2017 12:03:49 Jemmott, Tayveon L. ([id number]) -------------------------------------------------------------------------------- Wound Assessment Details Patient Name: Stephen [name] Date of Service: 12/07/2017 11:00 AM Medical Record Number: [id number] Patient Account Number: [id number] Date of Birth/Sex: Apr 06, 1961 (57 y.o. Male) Treating RN: Curtis Sites Primary Care Aneri Slagel: Karie Fetch Other Clinician: Referring Jahmya Onofrio: Karie Fetch Treating Emillee Talsma/Extender: STONE III, HOYT Weeks in Treatment: 1 Wound Status Wound Number: 1 Primary To be determined Etiology: Wound Location: Left Abdomen - Lower Quadrant Wound Status: Open Wounding Event: Gradually Appeared Comorbid Chronic Obstructive Pulmonary Disease Date Acquired: 10/09/2017 History: (COPD), Type II Diabetes Weeks Of Treatment: 1 Clustered Wound: No Photos Photo Uploaded By: Curtis Sites on 12/07/2017 11:57:12 Wound Measurements Length: (cm) 0.1 Width: (cm) 0.1 Depth: (cm) 0.1 Area: (cm) 0.008 Volume: (cm) 0.001 % Reduction in Area: 88.7% % Reduction in Volume: 85.7% Epithelialization: Medium (34-66%) Tunneling: No Undermining: No Wound Description Full Thickness Without Exposed Support Classification: Structures Wound Margin: Flat and Intact Exudate Medium Amount: Exudate Type: Serous Exudate Color: amber Foul Odor After Cleansing:  No Slough/Fibrino Yes Wound Bed Granulation Amount: Large (67-100%) Exposed Structure Granulation Quality: Red Fascia Exposed: No Necrotic Amount: None Present (0%) Fat Layer (Subcutaneous Tissue) Exposed:  No Tendon Exposed: No Muscle Exposed: No Joint Exposed: No Bone Exposed: No Stephen [name], [name] ([id number]) Periwound Skin Texture Texture Color No Abnormalities Noted: No No Abnormalities Noted: No Callus: No Atrophie Blanche: No Crepitus: No Cyanosis: No Excoriation: No Ecchymosis: No Induration: No Erythema: No Rash: No Hemosiderin Staining: No Scarring: Yes Mottled: No Pallor: No Moisture Rubor: No No Abnormalities Noted: No Dry / Scaly: No Temperature / Pain Maceration: No Temperature: No Abnormality Wound Preparation Ulcer Cleansing: Rinsed/Irrigated with Saline Topical Anesthetic Applied: None Treatment Notes Wound #1 (Left Abdomen - Lower Quadrant) 1. Cleansed with: Clean wound with Normal Saline 4. Dressing Applied: Other dressing (specify in notes) 5. Secondary Dressing Applied Dry Gauze Notes silvercel Electronic Signature(s) Signed: 12/07/2017 4:44:50 PM By: Curtis Sites Entered By: Curtis Sites on 12/07/2017 11:31:12 [name], [name] ([id number]) -------------------------------------------------------------------------------- Vitals Details Patient Name: Stephen [name] Date of Service: 12/07/2017 11:00 AM Medical Record Number: [id number] Patient Account Number: [id number] Date of Birth/Sex: 1961/02/08 (57 y.o. Male) Treating RN: Curtis Sites Primary Care Verenise Moulin: Karie Fetch Other Clinician: Referring Chequita Mofield: Karie Fetch Treating Alynna Hargrove/Extender: STONE III, HOYT Weeks in Treatment: 1 Vital Signs Time Taken: 11:25 Temperature (F): 98.4 Height (in): 68 Pulse (bpm): 70 Weight (lbs): 196 Respiratory Rate (breaths/min): 18 Body Mass Index (BMI): 29.8 Blood Pressure (mmHg): 133/74 Reference Range: 80 - 120 mg / dl Electronic Signature(s) Signed: 12/07/2017 4:44:50 PM By: Curtis Sites Entered By: Curtis Sites on 12/07/2017 11:25:48

## 2017-12-14 ENCOUNTER — Encounter: Payer: BLUE CROSS/BLUE SHIELD | Admitting: Physician Assistant

## 2017-12-14 DIAGNOSIS — E11622 Type 2 diabetes mellitus with other skin ulcer: Secondary | ICD-10-CM | POA: Diagnosis not present

## 2017-12-16 NOTE — Progress Notes (Signed)
Stephen [name], Catrell L. ([id number]) Visit Report for 12/14/2017 Chief Complaint Document Details Patient Name: Stephen [name], [name] Date of Service: 12/14/2017 1:30 PM Medical Record Number: [id number] Patient Account Number: [id number] Date of Birth/Sex: 1961/02/08 58(56 y.o. Male) Treating RN: Curtis Sitesorthy, Joanna Primary Care Provider: Karie FetchAYCOCK, NGWE Other Clinician: Referring Provider: Karie FetchAYCOCK, NGWE Treating Provider/Extender: STONE III, HOYT Weeks in Treatment: 2 Information Obtained from: Patient Chief Complaint ulcerated area around his left stoma Electronic Signature(s) Signed: 12/15/2017 8:05:24 AM By: Lenda KelpStone III, Hoyt PA-C Entered By: Lenda KelpStone III, Hoyt on 12/14/2017 13:59:55 Hata, Philemon L. ([id number]) -------------------------------------------------------------------------------- HPI Details Patient Name: Stephen [name], Asberry L. Date of Service: 12/14/2017 1:30 PM Medical Record Number: [id number] Patient Account Number: [id number] Date of Birth/Sex: 1961/02/08 [age] y.o. 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 Severity: Patient states wound are getting worse. Duration: Patient has had the wound for > 3 months prior to seeking treatment at the wound center Timing: Pain in wound is Intermittent (comes and goes Context: The wound would happen gradually Modifying Factors: Consults to this date include:PCP had given him a course of antibiotics Associated Signs and Symptoms: his stoma is functioning well HPI Description: 57 year old gentleman who is known to be a diabetic and smokes 2 packs of cigarettes a day, was referred by his PCP for a peristomal ulceration which she's had for about 3 months.  The patient was treated with Bactrim for a while and says it got a little better. He was referred to as for further care. Past medical history significant for diabetes mellitus which is not controlled well and has hyperglycemia and his last hemoglobin A1c was 8.6%. He also has known to have penile cancer in 2006 and [facility] is treated for COPD and has had a bowel perforation which was possibly perforated diverticulitis which was treated with surgery in March 2012. 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 I understand he was seen at [facility] by the general surgeons in 2013 and they told him to take care of his diabetes mellitus and stop smoking and come back for a review. The patient has not gone back for an opinion regarding this. Has noted he smokes 2 packets of cigarettes a day since the age of 57. 12/14/17 on evaluation today patient appears to be completely healed in regard to the ulcer around his stoma. There does not appear to be any open at all there is no drainage or discharge abnormally. He is very pleased with the progress. ====== Old notes Mr. Stephen [name] is a 57 year old male with history of diabetes and smoking who presents with skin irritation around his left lower quadrant ostomy site. Approximately 3 years ago he was in the hospital for a colon perforation which required a placement of an ostomy. Unfortunately for Mr. Stephen [name], he has continued to smoke which has precluded his surgeon from reversing his ostomy. He states that his blood sugars are in reasonable control at home. He was referred to the wound clinic because of skin irritation around his ostomy site. At present he did not have open wounds around the ostomy site. 2 weeks ago he saw a surgeon who placed Duoderm around the ostomy itself which has  helped some of the skin irritation. He also bought a new ostomy appliance with less adhesive which  also helped to starting skin irritation. He denies fever or chills at home. =========== Electronic Signature(s) Signed: 12/15/2017 8:05:24 AM By: Lenda Kelp PA-C Entered By: Lenda Kelp on 12/14/2017 17:25:19 Schultes, Jshaun L. ([id number]) -------------------------------------------------------------------------------- Physical Exam Details Patient Name: Stephen [name] Date of Service: 12/14/2017 1:30 PM Medical Record Number: [id number] Patient Account Number: [id number] Date of Birth/Sex: 01/01/1961 (57 y.o. Male) Treating RN: Curtis Sites Primary Care Provider: Karie Fetch Other Clinician: Referring Provider: Karie Fetch Treating Provider/Extender: STONE III, HOYT Weeks in Treatment: 2 Constitutional Well-nourished and well-hydrated in no acute distress. Respiratory normal breathing without difficulty. Psychiatric this patient is able to make decisions and demonstrates good insight into disease process. Alert and Oriented x 3. pleasant and cooperative. Notes The wound area appears to be completely healed at this time. Electronic Signature(s) Signed: 12/15/2017 8:05:24 AM By: Lenda Kelp PA-C Entered By: Lenda Kelp on 12/14/2017 17:25:39 Morawski, Judas LMarland Kitchen ([id number]) -------------------------------------------------------------------------------- Physician Orders Details Patient Name: Stephen [name] Date of Service: 12/14/2017 1:30 PM Medical Record Number: [id number] Patient Account Number: [id number] Date of Birth/Sex: 29-Apr-1961 (57 y.o. Male) Treating RN: Curtis Sites Primary Care Provider: Karie Fetch Other Clinician: Referring Provider: Karie Fetch Treating Provider/Extender: Linwood Dibbles, HOYT Weeks in Treatment: 2 Verbal / Phone Orders: No Diagnosis Coding ICD-10 Coding Code Description 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, cigarettes, with other nicotine-induced disorders K43.5  Parastomal hernia without obstruction or gangrene Unspecified open wound of abdominal wall, left lower quadrant without penetration into peritoneal cavity, S31.104A initial encounter Discharge From [facility] Services o Discharge from Wound Care Center Electronic Signature(s) Signed: 12/14/2017 3:54:03 PM By: Curtis Sites Signed: 12/15/2017 8:05:24 AM By: Lenda Kelp PA-C Entered By: Curtis Sites on 12/14/2017 14:04:27 Kinnard, Jaramiah L. ([id number]) -------------------------------------------------------------------------------- Problem List Details Patient Name: Stephen [name] Date of Service: 12/14/2017 1:30 PM Medical Record Number: [id number] Patient Account Number: [id number] Date of Birth/Sex: 1961-07-26 (57 y.o. Male) Treating RN: Curtis Sites Primary Care Provider: Karie Fetch Other Clinician: Referring Provider: Karie Fetch Treating Provider/Extender: Linwood Dibbles, HOYT Weeks in Treatment: 2 Active Problems ICD-10 Encounter Code Description Active Date Diagnosis E11.622 Type 2 diabetes mellitus with other skin ulcer 11/30/2017 Yes F17.218 Nicotine dependence, cigarettes, with other nicotine-induced 11/30/2017 Yes disorders K43.5 Parastomal hernia without obstruction or gangrene 11/30/2017 Yes S31.104A Unspecified open wound of abdominal wall, left lower quadrant 11/30/2017 Yes without penetration into peritoneal cavity, initial encounter Inactive Problems Resolved Problems Electronic Signature(s) Signed: 12/15/2017 8:05:24 AM By: Lenda Kelp PA-C Entered By: Lenda Kelp on 12/14/2017 13:59:45 Diop, Ghali L. ([id number]) -------------------------------------------------------------------------------- Progress Note Details Patient Name: Stephen [name] Date of Service: 12/14/2017 1:30 PM Medical Record Number: [id number] Patient Account Number: [id number] Date of Birth/Sex: 11-30-1961 (57 y.o. 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 Severity: Patient states wound are getting worse. Duration: Patient has had the wound for > 3 months prior to seeking treatment at the wound center Timing: Pain in wound is Intermittent (comes and goes  Context: The wound would happen gradually Modifying Factors: Consults to this date include:PCP had given him a course of antibiotics Associated Signs and Symptoms: his stoma is functioning well 57 year old gentleman who is known to be a diabetic and smokes 2 packs of cigarettes a day, was referred by his PCP for a peristomal ulceration which she's had for about 3 months. The patient was treated with Bactrim for a while and says it got a little better. He was referred to as for further care. Past medical history significant for diabetes mellitus which is not controlled well and has hyperglycemia and his last hemoglobin A1c was 8.6%. He also has known to have penile cancer in 2006 and was seen in its [facility] is treated for COPD and has had a bowel perforation which was possibly perforated diverticulitis which was treated with surgery in March 2012. 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 I understand he was seen at [facility] by the general surgeons in 2013 and they told him to take care of his diabetes mellitus and stop smoking and come back for a review. The patient has not gone back for an opinion regarding this. Has noted he smokes 2 packets of cigarettes a day  since the age of 80. 12/14/17 on evaluation today patient appears to be completely healed in regard to the ulcer around his stoma. There does not appear to be any open at all there is no drainage or discharge abnormally. He is very pleased with the progress. ====== Old notes Mr. Mccorkel is a 57 year old male with history of diabetes and smoking who presents with skin irritation around his left lower quadrant ostomy site. Approximately 3 years ago he was in the hospital for a colon perforation which required a placement of an ostomy. Unfortunately for Mr. Balaban, he has continued to smoke which has precluded his surgeon from reversing his ostomy. He states that his blood sugars are in reasonable control at home. He was referred to the wound clinic because of skin irritation around his ostomy site. At present he did not have open wounds around the ostomy site. 2 weeks ago he saw a surgeon who placed Duoderm around the ostomy itself which has helped some of the skin irritation. He also bought a new ostomy appliance with less adhesive which also helped to starting skin irritation. He denies fever or chills at home. =========== Bourassa, Emerson L. ([id number]) Objective Constitutional Well-nourished and well-hydrated in no acute distress. Vitals Time Taken: 1:30 PM, Height: 68 in, Weight: 196 lbs, BMI: 29.8, Temperature: 98.5 F, Pulse: 69 bpm, Respiratory Rate: 18 breaths/min, Blood Pressure: 141/75 mmHg. Respiratory normal breathing without difficulty. Psychiatric this patient is able to make decisions and demonstrates good insight into disease process. Alert and Oriented x 3. pleasant and cooperative. General Notes: The wound area appears to be completely healed at this time. Integumentary (Hair, Skin) Wound #1 status is Healed - Epithelialized. Original cause of wound was Gradually Appeared. The wound is located on the Left Abdomen - Lower Quadrant. The wound measures 0cm length x 0cm  width x 0cm depth; 0cm^2 area and 0cm^3 volume. Assessment Active Problems ICD-10 E11.622 - Type 2 diabetes mellitus with other skin ulcer F17.218 - Nicotine dependence, cigarettes, with other nicotine-induced disorders K43.5 - Parastomal hernia without obstruction or gangrene S31.104A - Unspecified open wound of abdominal wall, left lower quadrant without penetration into peritoneal cavity, initial encounter Plan Discharge  From [facility] Services: Discharge from Wound Care Center I'm gonna recommend that he protect the area for the next couple of weeks order to prevent any reopening or injury. He's in agreement with this plan. Otherwise we will see him in the future as needed if anything worsens or changes. Maple, Ein L. ([id number]) Electronic Signature(s) Signed: 12/15/2017 8:05:24 AM By: Lenda Kelp PA-C Entered By: Lenda Kelp on 12/14/2017 17:25:59 Kadar, Malaquias L. ([id number]) -------------------------------------------------------------------------------- SuperBill Details Patient Name: Stephen [name] Date of Service: 12/14/2017 Medical Record Number: [id number] Patient Account Number: [id number] Date of Birth/Sex: [birth year] (57 y.o. Male) Treating RN: Curtis Sites Primary Care Provider: Karie Fetch Other Clinician: Referring Provider: Karie Fetch Treating Provider/Extender: Linwood Dibbles, HOYT Weeks in Treatment: 2 Diagnosis Coding ICD-10 Codes Code Description 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, cigarettes, with other nicotine-induced disorders K43.5 Parastomal hernia without obstruction or gangrene Unspecified open wound of abdominal wall, left lower quadrant without penetration into peritoneal cavity, S31.104A initial encounter Facility Procedures CPT4 Code: [id number] Description: [phone] - WOUND CARE VISIT-LEV 2 EST PT Modifier: Quantity: 1 Physician Procedures CPT4: Description Modifier Quantity Code  [id number] - WC PHYS LEVEL 2 - EST PT 1 ICD-10 Diagnosis Description 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, cigarettes, with other nicotine-induced disorders K43.5  Parastomal hernia without obstruction or gangrene S31.104A Unspecified open wound of abdominal wall, left lower quadrant without penetration into peritoneal cavity, initial encounter Electronic Signature(s) Signed: 12/15/2017 8:05:24 AM By: Lenda Kelp PA-C Entered By: Lenda Kelp on 12/14/2017 72:53:66

## 2017-12-16 NOTE — Progress Notes (Signed)
Stephen [name], Dontea L. ([id number]) Visit Report for 12/14/2017 Arrival Information Details Patient Name: Stephen [name], [name] Date of Service: 12/14/2017 1:30 PM Medical Record Number: [id number] Patient Account Number: [id number] Date of Birth/Sex: [birth year](56 y.o. Male) Treating RN: Curtis Sitesorthy, Joanna Primary Care Taytum Wheller: Karie FetchAYCOCK, NGWE Other Clinician: Referring Roberts Bon: Karie FetchAYCOCK, NGWE Treating Maia Handa/Extender: STONE III, HOYT Weeks in Treatment: 2 Visit Information History Since Last Visit Added or deleted any medications: No Patient Arrived: Ambulatory Any new allergies or adverse reactions: No Arrival Time: 13:28 Had a fall or experienced change in No Accompanied By: self activities of daily living that may affect Transfer Assistance: None risk of falls: Patient Identification Verified: Yes Signs or symptoms of abuse/neglect since last visito No Secondary Verification Process Completed: Yes Hospitalized since last visit: No Patient Has Alerts: Yes Has Dressing in Place as Prescribed: Yes Patient Alerts: DMII Pain Present Now: No Electronic Signature(s) Signed: 12/14/2017 3:54:03 PM By: Curtis Sitesorthy, Joanna Entered By: Curtis Sitesorthy, Joanna on 12/14/2017 13:29:52 Burkett, Trae L. ([id number]) -------------------------------------------------------------------------------- Clinic Level of Care Assessment Details Patient Name: Stephen [name], [name] Date of Service: 12/14/2017 1:30 PM Medical Record Number: [id number] Patient Account Number: [id number] Date of Birth/Sex: [birth year](56 y.o. Male) Treating RN: Curtis Sitesorthy, Joanna Primary Care Mikell Kazlauskas: Karie FetchAYCOCK, NGWE Other Clinician: Referring Nivin Braniff: Karie FetchAYCOCK, NGWE Treating Charlize Hathaway/Extender: STONE III, HOYT Weeks in Treatment: 2 Clinic Level of Care Assessment Items TOOL 4 Quantity Score []  - Use when only an EandM is performed on FOLLOW-UP visit 0 ASSESSMENTS - Nursing Assessment / Reassessment X - Reassessment of Co-morbidities (includes  updates in patient status) 1 10 X- 1 5 Reassessment of Adherence to Treatment Plan ASSESSMENTS - Wound and Skin Assessment / Reassessment X - Simple Wound Assessment / Reassessment - one wound 1 5 []  - 0 Complex Wound Assessment / Reassessment - multiple wounds []  - 0 Dermatologic / Skin Assessment (not related to wound area) ASSESSMENTS - Focused Assessment []  - Circumferential Edema Measurements - multi extremities 0 []  - 0 Nutritional Assessment / Counseling / Intervention []  - 0 Lower Extremity Assessment (monofilament, tuning fork, pulses) []  - 0 Peripheral Arterial Disease Assessment (using hand held doppler) ASSESSMENTS - Ostomy and/or Continence Assessment and Care []  - Incontinence Assessment and Management 0 []  - 0 Ostomy Care Assessment and Management (repouching, etc.) PROCESS - Coordination of Care X - Simple Patient / Family Education for ongoing care 1 15 []  - 0 Complex (extensive) Patient / Family Education for ongoing care []  - 0 Staff obtains ChiropractorConsents, Records, Test Results / Process Orders []  - 0 Staff telephones HHA, Nursing Homes / Clarify orders / etc []  - 0 Routine Transfer to another Facility (non-emergent condition) []  - 0 Routine Hospital Admission (non-emergent condition) []  - 0 New Admissions / Manufacturing engineernsurance Authorizations / Ordering NPWT, Apligraf, etc. []  - 0 Emergency Hospital Admission (emergent condition) X- 1 10 Simple Discharge Coordination Goedken, Sacha L. ([id number]) []  - 0 Complex (extensive) Discharge Coordination PROCESS - Special Needs []  - Pediatric / Minor Patient Management 0 []  - 0 Isolation Patient Management []  - 0 Hearing / Language / Visual special needs []  - 0 Assessment of Community assistance (transportation, D/C planning, etc.) []  - 0 Additional assistance / Altered mentation []  - 0 Support Surface(s) Assessment (bed, cushion, seat, etc.) INTERVENTIONS - Wound Cleansing / Measurement X - Simple Wound Cleansing  - one wound 1 5 []  - 0 Complex Wound Cleansing - multiple wounds X- 1 5 Wound Imaging (photographs - any number of wounds) []  - 0 Wound Tracing (instead  of photographs) X- 1 5 Simple Wound Measurement - one wound []  - 0 Complex Wound Measurement - multiple wounds INTERVENTIONS - Wound Dressings []  - Small Wound Dressing one or multiple wounds 0 []  - 0 Medium Wound Dressing one or multiple wounds []  - 0 Large Wound Dressing one or multiple wounds []  - 0 Application of Medications - topical []  - 0 Application of Medications - injection INTERVENTIONS - Miscellaneous []  - External ear exam 0 []  - 0 Specimen Collection (cultures, biopsies, blood, body fluids, etc.) []  - 0 Specimen(s) / Culture(s) sent or taken to Lab for analysis []  - 0 Patient Transfer (multiple staff / Nurse, adult / Similar devices) []  - 0 Simple Staple / Suture removal (25 or less) []  - 0 Complex Staple / Suture removal (26 or more) []  - 0 Hypo / Hyperglycemic Management (close monitor of Blood Glucose) []  - 0 Ankle / Brachial Index (ABI) - do not check if billed separately X- 1 5 Vital Signs Mcdonnell, Zachery L. ([id number]) Has the patient been seen at the hospital within the last three years: Yes Total Score: 65 Level Of Care: New/Established - Level 2 Electronic Signature(s) Signed: 12/14/2017 3:54:03 PM By: Curtis Sites Entered By: Curtis Sites on 12/14/2017 14:10:16 Longshore, Lindell L. ([id number]) -------------------------------------------------------------------------------- Encounter Discharge Information Details Patient Name: Stephen [name] Date of Service: 12/14/2017 1:30 PM Medical Record Number: [id number] Patient Account Number: [id number] Date of Birth/Sex: 1961-03-24 (57 y.o. Male) Treating RN: Curtis Sites Primary Care Dakari Stabler: Karie Fetch Other Clinician: Referring Martine Bleecker: Karie Fetch Treating Ksenia Kunz/Extender: Linwood Dibbles, HOYT Weeks in Treatment: 2 Encounter  Discharge Information Items Discharge Pain Level: 0 Discharge Condition: Stable Ambulatory Status: Ambulatory Discharge Destination: Home Transportation: Private Auto Accompanied By: self Schedule Follow-up Appointment: No Medication Reconciliation completed and No provided to Patient/Care Mairely Foxworth: Patient Clinical Summary of Care: Declined Electronic Signature(s) Signed: 12/15/2017 10:03:49 AM By: Gwenlyn Perking Previous Signature: 12/14/2017 2:10:36 PM Version By: Curtis Sites Entered By: Gwenlyn Perking on 12/14/2017 14:10:41 Buckhalter, Brenyn L. ([id number]) -------------------------------------------------------------------------------- Multi-Disciplinary Care Plan Details Patient Name: Stephen [name] Date of Service: 12/14/2017 1:30 PM Medical Record Number: [id number] Patient Account Number: [id number] Date of Birth/Sex: January 12, 1961 (57 y.o. Male) Treating RN: Curtis Sites Primary Care Ainsley Sanguinetti: Karie Fetch Other Clinician: Referring Rudell Ortman: Karie Fetch Treating Lyrik Dockstader/Extender: Linwood Dibbles, HOYT Weeks in Treatment: 2 Active Inactive Electronic Signature(s) Signed: 12/14/2017 2:09:39 PM By: Curtis Sites Entered By: Curtis Sites on 12/14/2017 14:09:39 Rickert, Aasir L. ([id number]) -------------------------------------------------------------------------------- Pain Assessment Details Patient Name: Stephen [name] Date of Service: 12/14/2017 1:30 PM Medical Record Number: [id number] Patient Account Number: [id number] Date of Birth/Sex: Mar 01, 1961 (57 y.o. Male) Treating RN: Curtis Sites Primary Care Jaydynn Wolford: Karie Fetch Other Clinician: Referring Vetra Shinall: Karie Fetch Treating Jaquavian Firkus/Extender: STONE III, HOYT Weeks in Treatment: 2 Active Problems Location of Pain Severity and Description of Pain Patient Has Paino No Site Locations Pain Management and Medication Current Pain Management: Electronic Signature(s) Signed: 12/14/2017 3:54:03 PM  By: Curtis Sites Entered By: Curtis Sites on 12/14/2017 13:29:58 Harte, Daymon L. ([id number]) -------------------------------------------------------------------------------- Patient/Caregiver Education Details Patient Name: Stephen [name] Date of Service: 12/14/2017 1:30 PM Medical Record Number: [id number] Patient Account Number: [id number] Date of Birth/Gender: June 25, 1961 (57 y.o. Male) Treating RN: Curtis Sites Primary Care Physician: Karie Fetch Other Clinician: Referring Physician: Karie Fetch Treating Physician/Extender: Skeet Simmer in Treatment: 2 Education Assessment Education Provided To: Patient Education Topics Provided Basic Hygiene: Handouts: Other: continued care of newly healed ulcer site Methods: Explain/Verbal Responses: State content correctly  Electronic Signature(s) Signed: 12/14/2017 3:54:03 PM By: Curtis Sites Entered By: Curtis Sites on 12/14/2017 14:10:59 Mcdanel, Chun L. ([id number]) -------------------------------------------------------------------------------- Wound Assessment Details Patient Name: Stephen [name] Date of Service: 12/14/2017 1:30 PM Medical Record Number: [id number] Patient Account Number: [id number] Date of Birth/Sex: [birth year] (57 y.o. Male) Treating RN: Curtis Sites Primary Care Stephane Junkins: Karie Fetch Other Clinician: Referring Rakin Lemelle: Karie Fetch Treating Aryav Wimberly/Extender: STONE III, HOYT Weeks in Treatment: 2 Wound Status Wound Number: 1 Primary Etiology: Atypical Wound Location: Left Abdomen - Lower Quadrant Wound Status: Healed - Epithelialized Wounding Event: Gradually Appeared Date Acquired: 10/09/2017 Weeks Of Treatment: 2 Clustered Wound: No Photos Photo Uploaded By: Curtis Sites on 12/14/2017 14:16:31 Wound Measurements Length: (cm) 0 Width: (cm) 0 Depth: (cm) 0 Area: (cm) 0 Volume: (cm) 0 % Reduction in Area: 100% % Reduction in Volume: 100% Wound  Description Full Thickness Without Exposed Support Classification: Structures Periwound Skin Texture Texture Color No Abnormalities Noted: No No Abnormalities Noted: No Moisture No Abnormalities Noted: No Electronic Signature(s) Signed: 12/14/2017 3:54:03 PM By: Curtis Sites Entered By: Curtis Sites on 12/14/2017 14:04:12 Lensing, Jeremi L. ([id number]) -------------------------------------------------------------------------------- Vitals Details Patient Name: Stephen [name] Date of Service: 12/14/2017 1:30 PM Medical Record Number: [id number] Patient Account Number: [id number] Date of Birth/Sex: 07/11/1961 (57 y.o. Male) Treating RN: Curtis Sites Primary Care Donnalynn Wheeless: Karie Fetch Other Clinician: Referring Ebany Bowermaster: Karie Fetch Treating Lissandro Dilorenzo/Extender: STONE III, HOYT Weeks in Treatment: 2 Vital Signs Time Taken: 13:30 Temperature (F): 98.5 Height (in): 68 Pulse (bpm): 69 Weight (lbs): 196 Respiratory Rate (breaths/min): 18 Body Mass Index (BMI): 29.8 Blood Pressure (mmHg): 141/75 Reference Range: 80 - 120 mg / dl Electronic Signature(s) Signed: 12/14/2017 3:54:03 PM By: Curtis Sites Entered By: Curtis Sites on 12/14/2017 13:30:17

## 2022-04-11 DIAGNOSIS — R350 Frequency of micturition: Secondary | ICD-10-CM | POA: Diagnosis not present

## 2022-04-11 DIAGNOSIS — K5900 Constipation, unspecified: Secondary | ICD-10-CM | POA: Diagnosis not present

## 2022-04-11 NOTE — Progress Notes (Signed)
 Subjective:    Patient ID: Keyontay Stolz is a 61 y.o. male.  HPI: PRESENTS WITH 2 ISSUES. FIRST HE THINKS HE MAY HAVE A UTI. HAS NOTED FREQUENCY . NO DYSURIA. NO HX OF PROSTATE ISSUES. NO FEVER OR BACK PAIN SECOND HE HAS BEEN HAVING SOME ISSUES WITH CONSTIPATION AND HARD STOOLS. 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 IS TAKING MURALAX WITH GOOD RESULTS. RECORDS REVIEWED.   Past Medical History:  has a past medical history of Diabetes mellitus (CMS-HCC), HTN (hypertension), and Hyperlipidemia, mixed. Past Surgical History:  has a past surgical history that includes penile cancer  and Colostomy. Family History: family history includes Diabetes type II in his father. Social History:  reports that he has been smoking cigarettes. He does not have any smokeless tobacco history on file. He reports that he does not drink alcohol and does not use drugs. Prior to encounter Medications:  Current Outpatient Medications on File Prior to Visit  Medication Sig Dispense Refill   metFORMIN (GLUCOPHAGE) 1000 MG tablet Take 1,000 mg by mouth 2 (two) times daily with meals.     PROVENTIL  HFA 90 mcg/actuation inhaler  (Patient not taking: Reported on 04/11/2022)     TRUE METRIX GLUCOSE TEST STRIP test strip  (Patient not taking: Reported on 04/11/2022)     TRUEPLUS LANCETS  (Patient not taking: Reported on 04/11/2022)     No current facility-administered medications on file prior to visit.   Allergies: is allergic to latex.  This a new patient is here today for: office visit.  Review of Systems    Objective:  Physical Exam Vitals and nursing note reviewed.  Constitutional:      General: He is not in acute distress.    Appearance: Normal appearance. He is not ill-appearing or toxic-appearing.  HENT:     Head: Atraumatic.  Cardiovascular:     Rate and Rhythm: Normal rate and regular rhythm.  Abdominal:     Tenderness: There is no right CVA tenderness or left CVA tenderness.     Comments: COLOSTOMY  NOTED.   Skin:    General: Skin is warm and dry.  Neurological:     Mental Status: He is alert.    URINE FREQUENCY MAY BE RELATED  TO ELEVATED BLOOD SUGARS. DISCUSSED MONITORING BLOOD SUGARS.    Assessment:    1. Frequency of urination   - Urinalysis w/Microscopic - Urine Culture, Routine - [facility] - ciprofloxacin HCl (CIPRO) 500 MG tablet; Take 1 tablet (500 mg total) by mouth 2 (two) times daily for 7 days  Dispense: 14 tablet; Refill: 0  2. Constipation, unspecified constipation type     Plan:    Patient Instructions  CONSTIPATION DRY METAMUCIL WAFERS   I HAVE CULTURED YOUR URINE ANTIBIOTIC CIPRO. FOR URINE AND PROSTATED  AVOID CAFFEINE PRODUCTS WILL CONTACT YOU IF ANY CHANGES INDICATED.

## 2022-05-20 DIAGNOSIS — Z23 Encounter for immunization: Secondary | ICD-10-CM | POA: Diagnosis not present

## 2022-05-20 DIAGNOSIS — S50812A Abrasion of left forearm, initial encounter: Secondary | ICD-10-CM | POA: Diagnosis not present

## 2022-05-20 DIAGNOSIS — L03114 Cellulitis of left upper limb: Secondary | ICD-10-CM | POA: Diagnosis not present

## 2022-05-20 DIAGNOSIS — W5503XA Scratched by cat, initial encounter: Secondary | ICD-10-CM | POA: Diagnosis not present

## 2022-05-20 NOTE — Progress Notes (Signed)
 Stephen [name] is a 61 y.o. male here for established patient visit to discuss:  History of Present Illness:   1. Cat scratch left forearm: 61 y.o. male, comes in today after being scratched on the ventral aspect of the left forearm (patient was not bitten) by his cat approximately 3 weeks ago.  He has been cleaning the area with rubbing alcohol, hydrogen peroxide, Neosporin.  Has developed worsening redness, mild swelling, mild tenderness to palpation at the site.  No purulent drainage from the site.  He reports that his last tetanus vaccine was in the 1990s.  Denies fever, chills, fatigue, malaise, neck pain or stiffness, trismus, headache, lymphadenopathy, numbness, weakness, tingling, slurring speech, change in cognition, nausea, vomiting, abdominal pain   The following portions of the patient's history were reviewed and updated as appropriate.   Past Medical History:   Past Medical History:  Diagnosis Date   Diabetes mellitus (CMS-HCC)    HTN (hypertension)    Hyperlipidemia, mixed      Past Surgical History:   Past Surgical History:  Procedure Laterality Date   COLOSTOMY     penile cancer        Allergies:   Allergies  Allergen Reactions   Latex Rash     Medications:   Prior to Admission medications   Medication Sig Taking? Last Dose  metFORMIN (GLUCOPHAGE) 1000 MG tablet Take 1,000 mg by mouth 2 (two) times daily with meals. Yes Taking  azithromycin  (ZITHROMAX ) 250 MG tablet 2 tabs on day one, then 1 tab daily x 4 days    doxycycline (VIBRAMYCIN) 100 MG capsule Take 1 capsule (100 mg total) by mouth 2 (two) times daily for 10 days    mupirocin (BACTROBAN) 2 % ointment Apply topically once daily    PROVENTIL  HFA 90 mcg/actuation inhaler   Not Taking  TRUE METRIX GLUCOSE TEST STRIP test strip   Not Taking  TRUEPLUS LANCETS   Not Taking     Family History:   Family History  Problem Relation Age of Onset   Diabetes type II Father      Social  History:   Social History   Socioeconomic History   Marital status: Married  Tobacco Use   Smoking status: Every Day    Types: Cigarettes  Vaping Use   Vaping Use: Never used  Substance and Sexual Activity   Alcohol use: No   Drug use: No   Sexual activity: Defer     Review of Systems:   As per HPI   Vital Signs:   Vitals:   05/20/22 0807  BP: 136/83  Pulse: 78  Temp: 36.8 C (98.2 F)  SpO2: 100%  Weight: 81.5 kg (179 lb 9.6 oz)  Height: 172.7 cm (5' 8)     Body mass index is 27.31 kg/m.   Physical Exam:   General:  Well appearing, pleasant, no acute distress,  Head: No trismus Eyes:  No conjunctivitis  Mouth:  Moist mucous membranes without erythema or exudate or tonsillar hypertrophy Neck: Full range of motion Lymph: No axillary or supraclavicular or neck lymph nodes palpated Lungs:  Clear to auscultation bilaterally without wheeze, rale or rhonchi. Normal work of breathing Cardiovascular:  Regular rate and rhythm without murmur or gallop Abdomen:  Soft, nontender, nondistended, normal bowel sounds Skin:  Warm and dry with good turgor.  8 x 13 cm area of erythema and mild induration with central linear healing shallow lacerations (3 total) without fluctuance or purulence Neurologic:  Alert  Assessment and Plan:   1. Cat scratch of forearm, left, initial encounter  (primary encounter diagnosis) Cellulitis of forearm, left: Acute.  Cat scratch of the left forearm complicated by acute cellulitis.  Tetanus vaccine provided today.  Will cover from antibiotic standpoint with doxycycline (for MRSA coverage), azithromycin  (for Bartonella coverage).  Topical mupirocin antibiotic ointment daily.  Antibiotic ointment and dressing applied here in clinic.  Follow-up 48 to 72 hours if not improving.. Handout given.  Red flags reviewed.    Orders for this Visit:   Diagnoses and all orders for this visit:  Cat scratch of forearm, left, initial encounter -      Tdap (>=72YR) vaccine (Adacel/Boostrix)  Cellulitis of forearm, left -     Tdap (>=72YR) vaccine (Adacel/Boostrix)  Other orders -     azithromycin  (ZITHROMAX ) 250 MG tablet; 2 tabs on day one, then 1 tab daily x 4 days -     doxycycline (VIBRAMYCIN) 100 MG capsule; Take 1 capsule (100 mg total) by mouth 2 (two) times daily for 10 days -     mupirocin (BACTROBAN) 2 % ointment; Apply topically once daily     Portions of this note were created using dictation software and may contain typographical errors.   Patient received an After Visit Summary

## 2024-12-08 ENCOUNTER — Emergency Department: Payer: Self-pay

## 2024-12-08 ENCOUNTER — Inpatient Hospital Stay
Admission: EM | Admit: 2024-12-08 | Discharge: 2025-01-05 | DRG: 871 | Disposition: E | Payer: Self-pay | Attending: Pulmonary Disease | Admitting: Pulmonary Disease

## 2024-12-08 ENCOUNTER — Inpatient Hospital Stay: Payer: Self-pay

## 2024-12-08 ENCOUNTER — Encounter: Payer: Self-pay | Admitting: Medical Oncology

## 2024-12-08 ENCOUNTER — Other Ambulatory Visit: Payer: Self-pay

## 2024-12-08 ENCOUNTER — Inpatient Hospital Stay: Admit: 2024-12-08 | Discharge: 2024-12-08 | Disposition: A | Attending: Pulmonary Disease

## 2024-12-08 DIAGNOSIS — F17210 Nicotine dependence, cigarettes, uncomplicated: Secondary | ICD-10-CM | POA: Diagnosis present

## 2024-12-08 DIAGNOSIS — E119 Type 2 diabetes mellitus without complications: Secondary | ICD-10-CM | POA: Diagnosis present

## 2024-12-08 DIAGNOSIS — D72819 Decreased white blood cell count, unspecified: Secondary | ICD-10-CM | POA: Diagnosis present

## 2024-12-08 DIAGNOSIS — J9601 Acute respiratory failure with hypoxia: Principal | ICD-10-CM | POA: Diagnosis present

## 2024-12-08 DIAGNOSIS — N179 Acute kidney failure, unspecified: Secondary | ICD-10-CM

## 2024-12-08 DIAGNOSIS — E872 Acidosis, unspecified: Secondary | ICD-10-CM | POA: Diagnosis present

## 2024-12-08 DIAGNOSIS — I468 Cardiac arrest due to other underlying condition: Secondary | ICD-10-CM | POA: Diagnosis present

## 2024-12-08 DIAGNOSIS — I110 Hypertensive heart disease with heart failure: Secondary | ICD-10-CM | POA: Diagnosis present

## 2024-12-08 DIAGNOSIS — J440 Chronic obstructive pulmonary disease with acute lower respiratory infection: Secondary | ICD-10-CM | POA: Diagnosis present

## 2024-12-08 DIAGNOSIS — J181 Lobar pneumonia, unspecified organism: Secondary | ICD-10-CM

## 2024-12-08 DIAGNOSIS — I509 Heart failure, unspecified: Secondary | ICD-10-CM | POA: Diagnosis present

## 2024-12-08 DIAGNOSIS — E785 Hyperlipidemia, unspecified: Secondary | ICD-10-CM | POA: Diagnosis present

## 2024-12-08 DIAGNOSIS — J159 Unspecified bacterial pneumonia: Secondary | ICD-10-CM | POA: Diagnosis present

## 2024-12-08 DIAGNOSIS — Z933 Colostomy status: Secondary | ICD-10-CM | POA: Diagnosis not present

## 2024-12-08 DIAGNOSIS — Z1152 Encounter for screening for COVID-19: Secondary | ICD-10-CM | POA: Diagnosis not present

## 2024-12-08 DIAGNOSIS — A419 Sepsis, unspecified organism: Principal | ICD-10-CM | POA: Diagnosis present

## 2024-12-08 DIAGNOSIS — Z66 Do not resuscitate: Secondary | ICD-10-CM | POA: Diagnosis present

## 2024-12-08 DIAGNOSIS — N170 Acute kidney failure with tubular necrosis: Secondary | ICD-10-CM | POA: Diagnosis present

## 2024-12-08 DIAGNOSIS — R6520 Severe sepsis without septic shock: Secondary | ICD-10-CM | POA: Diagnosis present

## 2024-12-08 DIAGNOSIS — Z91040 Latex allergy status: Secondary | ICD-10-CM

## 2024-12-08 DIAGNOSIS — R54 Age-related physical debility: Secondary | ICD-10-CM | POA: Diagnosis present

## 2024-12-08 LAB — BLOOD CULTURE ID PANEL (REFLEXED) - BCID2

## 2024-12-08 LAB — HEMOGLOBIN A1C
Hgb A1c MFr Bld: 7.4 % — ABNORMAL HIGH (ref 4.8–5.6)
Mean Plasma Glucose: 165.68 mg/dL

## 2024-12-08 LAB — BLOOD GAS, ARTERIAL
Acid-base deficit: 18.2 mmol/L — ABNORMAL HIGH (ref 0.0–2.0)
Bicarbonate: 9.6 mmol/L — ABNORMAL LOW (ref 20.0–28.0)
O2 Content: 6 L/min
O2 Saturation: 86.8 %
Patient temperature: 37
pCO2 arterial: 29 mmHg — ABNORMAL LOW (ref 32–48)
pH, Arterial: 7.13 — CL (ref 7.35–7.45)
pO2, Arterial: 64 mmHg — ABNORMAL LOW (ref 83–108)

## 2024-12-08 LAB — COMPREHENSIVE METABOLIC PANEL WITH GFR
ALT: 47 U/L — ABNORMAL HIGH (ref 0–44)
AST: 73 U/L — ABNORMAL HIGH (ref 15–41)
Albumin: 3.6 g/dL (ref 3.5–5.0)
Alkaline Phosphatase: 179 U/L — ABNORMAL HIGH (ref 38–126)
Anion gap: 29 — ABNORMAL HIGH (ref 5–15)
BUN: 55 mg/dL — ABNORMAL HIGH (ref 8–23)
CO2: 14 mmol/L — ABNORMAL LOW (ref 22–32)
Calcium: 8.6 mg/dL — ABNORMAL LOW (ref 8.9–10.3)
Chloride: 84 mmol/L — ABNORMAL LOW (ref 98–111)
Creatinine, Ser: 3.72 mg/dL — ABNORMAL HIGH (ref 0.61–1.24)
GFR, Estimated: 17 mL/min — ABNORMAL LOW
Glucose, Bld: 183 mg/dL — ABNORMAL HIGH (ref 70–99)
Potassium: 4.6 mmol/L (ref 3.5–5.1)
Sodium: 126 mmol/L — ABNORMAL LOW (ref 135–145)
Total Bilirubin: 0.8 mg/dL (ref 0.0–1.2)
Total Protein: 7.3 g/dL (ref 6.5–8.1)

## 2024-12-08 LAB — BLOOD GAS, VENOUS
Acid-base deficit: 13.2 mmol/L — ABNORMAL HIGH (ref 0.0–2.0)
Bicarbonate: 13.6 mmol/L — ABNORMAL LOW (ref 20.0–28.0)
O2 Saturation: 66 %
Patient temperature: 37
pCO2, Ven: 34 mmHg — ABNORMAL LOW (ref 44–60)
pH, Ven: 7.21 — ABNORMAL LOW (ref 7.25–7.43)
pO2, Ven: 44 mmHg (ref 32–45)

## 2024-12-08 LAB — HIV ANTIBODY (ROUTINE TESTING W REFLEX): HIV Screen 4th Generation wRfx: NONREACTIVE

## 2024-12-08 LAB — CBC WITH DIFFERENTIAL/PLATELET
Abs Immature Granulocytes: 0.42 K/uL — ABNORMAL HIGH (ref 0.00–0.07)
Basophils Absolute: 0 K/uL (ref 0.0–0.1)
Basophils Relative: 1 %
Eosinophils Absolute: 0 K/uL (ref 0.0–0.5)
Eosinophils Relative: 1 %
HCT: 37.7 % — ABNORMAL LOW (ref 39.0–52.0)
Hemoglobin: 13 g/dL (ref 13.0–17.0)
Immature Granulocytes: 33 %
Lymphocytes Relative: 13 %
Lymphs Abs: 0.2 K/uL — ABNORMAL LOW (ref 0.7–4.0)
MCH: 32.2 pg (ref 26.0–34.0)
MCHC: 34.5 g/dL (ref 30.0–36.0)
MCV: 93.3 fL (ref 80.0–100.0)
Monocytes Absolute: 0 K/uL — ABNORMAL LOW (ref 0.1–1.0)
Monocytes Relative: 2 %
Neutro Abs: 0.7 K/uL — ABNORMAL LOW (ref 1.7–7.7)
Neutrophils Relative %: 50 %
Platelets: 171 K/uL (ref 150–400)
RBC: 4.04 MIL/uL — ABNORMAL LOW (ref 4.22–5.81)
RDW: 14.3 % (ref 11.5–15.5)
Smear Review: NORMAL
WBC: 1.3 K/uL — CL (ref 4.0–10.5)
nRBC: 1.6 % — ABNORMAL HIGH (ref 0.0–0.2)

## 2024-12-08 LAB — PROTIME-INR
INR: 1.6 — ABNORMAL HIGH (ref 0.8–1.2)
Prothrombin Time: 19.6 s — ABNORMAL HIGH (ref 11.4–15.2)

## 2024-12-08 LAB — TROPONIN T, HIGH SENSITIVITY: Troponin T High Sensitivity: 33 ng/L — ABNORMAL HIGH (ref 0–19)

## 2024-12-08 LAB — TECHNOLOGIST SMEAR REVIEW: Plt Morphology: NORMAL

## 2024-12-08 LAB — ECHOCARDIOGRAM COMPLETE
AR max vel: 3.01 cm2
AV Peak grad: 6.2 mmHg
Ao pk vel: 1.24 m/s
Area-P 1/2: 6.37 cm2
Calc EF: 59 %
Height: 68 in
S' Lateral: 3.3 cm
Single Plane A2C EF: 62.8 %
Single Plane A4C EF: 58.4 %
Weight: 2624.36 [oz_av]

## 2024-12-08 LAB — RESP PANEL BY RT-PCR (RSV, FLU A&B, COVID)  RVPGX2
Influenza A by PCR: NEGATIVE
Influenza B by PCR: NEGATIVE
Resp Syncytial Virus by PCR: NEGATIVE
SARS Coronavirus 2 by RT PCR: NEGATIVE

## 2024-12-08 LAB — PRO BRAIN NATRIURETIC PEPTIDE: Pro Brain Natriuretic Peptide: >35000 pg/mL — ABNORMAL HIGH

## 2024-12-08 LAB — LACTIC ACID, PLASMA
Lactic Acid, Venous: 8.6 mmol/L (ref 0.5–1.9)
Lactic Acid, Venous: >9 mmol/L (ref 0.5–1.9)

## 2024-12-08 MED ORDER — IPRATROPIUM-ALBUTEROL 0.5-2.5 (3) MG/3ML IN SOLN
3.0000 mL | Freq: Once | RESPIRATORY_TRACT | Status: AC
  Administered 2024-12-08: 3 mL via RESPIRATORY_TRACT
  Filled 2024-12-08: qty 3

## 2024-12-08 MED ORDER — INSULIN ASPART 100 UNIT/ML IJ SOLN: 0.0000 [IU] | Freq: Three times a day (TID) | INTRAMUSCULAR | Status: DC

## 2024-12-08 MED ORDER — IPRATROPIUM-ALBUTEROL 0.5-2.5 (3) MG/3ML IN SOLN
3.0000 mL | Freq: Four times a day (QID) | RESPIRATORY_TRACT | Status: DC
  Administered 2024-12-08: 3 mL via RESPIRATORY_TRACT
  Filled 2024-12-08: qty 3

## 2024-12-08 MED ORDER — EPINEPHRINE HCL 5 MG/250ML IV SOLN IN NS
0.5000 ug/min | INTRAVENOUS | Status: DC
  Filled 2024-12-08: qty 250

## 2024-12-08 MED ORDER — SODIUM CHLORIDE 0.9 % IV SOLN: 2.0000 g | INTRAVENOUS | Status: DC

## 2024-12-08 MED ORDER — SODIUM CHLORIDE 0.9 % IV SOLN
2.0000 g | Freq: Once | INTRAVENOUS | Status: AC
  Administered 2024-12-08: 2 g via INTRAVENOUS
  Filled 2024-12-08: qty 20

## 2024-12-08 MED ORDER — ROCURONIUM BROMIDE 10 MG/ML (PF) SYRINGE
PREFILLED_SYRINGE | INTRAVENOUS | Status: AC
  Filled 2024-12-08: qty 10

## 2024-12-08 MED ORDER — LACTATED RINGERS IV BOLUS
1500.0000 mL | Freq: Once | INTRAVENOUS | Status: AC
  Administered 2024-12-08: 1500 mL via INTRAVENOUS

## 2024-12-08 MED ORDER — SODIUM CHLORIDE 0.9 % IV SOLN
500.0000 mg | Freq: Once | INTRAVENOUS | Status: AC
  Administered 2024-12-08: 500 mg via INTRAVENOUS
  Filled 2024-12-08: qty 5

## 2024-12-08 MED ORDER — LACTATED RINGERS IV BOLUS: 500.0000 mL | Freq: Once | INTRAVENOUS | Status: DC

## 2024-12-08 MED ORDER — LACTATED RINGERS IV SOLN: INTRAVENOUS | Status: DC

## 2024-12-08 MED ORDER — LACTATED RINGERS IV BOLUS
500.0000 mL | Freq: Once | INTRAVENOUS | Status: AC
  Administered 2024-12-08: 500 mL via INTRAVENOUS

## 2024-12-08 MED ORDER — INSULIN ASPART 100 UNIT/ML IJ SOLN: 0.0000 [IU] | Freq: Every day | INTRAMUSCULAR | Status: DC

## 2024-12-08 MED ORDER — METHYLPREDNISOLONE SODIUM SUCC 125 MG IJ SOLR
125.0000 mg | INTRAMUSCULAR | Status: AC
  Administered 2024-12-08: 125 mg via INTRAVENOUS
  Filled 2024-12-08: qty 2

## 2024-12-08 MED ORDER — ROCURONIUM BROMIDE 10 MG/ML (PF) SYRINGE
PREFILLED_SYRINGE | INTRAVENOUS | Status: AC | PRN
  Administered 2024-12-08: 40 mg via INTRAVENOUS
  Administered 2024-12-08: 80 mg via INTRAVENOUS

## 2024-12-08 MED ORDER — SODIUM CHLORIDE 0.9 % IV SOLN: 500.0000 mg | INTRAVENOUS | Status: DC

## 2024-12-08 MED ORDER — SODIUM BICARBONATE 8.4 % IV SOLN
INTRAVENOUS | Status: AC | PRN
  Administered 2024-12-08 (×3): 50 meq via INTRAVENOUS

## 2024-12-08 MED ORDER — VANCOMYCIN HCL 1750 MG/350ML IV SOLN
1750.0000 mg | Freq: Once | INTRAVENOUS | Status: AC
  Administered 2024-12-08: 1750 mg via INTRAVENOUS
  Filled 2024-12-08: qty 350

## 2024-12-08 MED ORDER — NOREPINEPHRINE BITARTRATE 1 MG/ML IV SOLN
INTRAVENOUS | Status: AC | PRN
  Administered 2024-12-08: 10 ug via INTRAVENOUS

## 2024-12-08 MED ORDER — CALCIUM CHLORIDE 10 % IV SOLN
INTRAVENOUS | Status: AC | PRN
  Administered 2024-12-08: 2 g via INTRAVENOUS

## 2024-12-08 MED ORDER — SENNA 8.6 MG PO TABS: 1.0000 | ORAL_TABLET | Freq: Two times a day (BID) | ORAL | Status: DC | PRN

## 2024-12-08 MED ORDER — HEPARIN SODIUM (PORCINE) 5000 UNIT/ML IJ SOLN
5000.0000 [IU] | Freq: Three times a day (TID) | INTRAMUSCULAR | Status: DC
  Administered 2024-12-08: 5000 [IU] via SUBCUTANEOUS
  Filled 2024-12-08: qty 1

## 2024-12-08 MED ORDER — EPINEPHRINE 1 MG/10ML IV SOSY
PREFILLED_SYRINGE | INTRAVENOUS | Status: AC | PRN
  Administered 2024-12-08 (×8): 1 mg via INTRAVENOUS

## 2024-12-08 MED ORDER — POLYETHYLENE GLYCOL 3350 17 G PO PACK: 17.0000 g | PACK | Freq: Every day | ORAL | Status: DC | PRN

## 2024-12-08 MED ORDER — CHLORHEXIDINE GLUCONATE CLOTH 2 % EX PADS: 6.0000 | MEDICATED_PAD | Freq: Every day | CUTANEOUS | Status: DC

## 2024-12-09 LAB — PATHOLOGIST SMEAR REVIEW

## 2024-12-09 LAB — CBG MONITORING, ED: Glucose-Capillary: 111 mg/dL — ABNORMAL HIGH (ref 70–99)

## 2024-12-20 LAB — CULTURE, BLOOD (ROUTINE X 2): Special Requests: ADEQUATE

## 2024-12-31 LAB — MISCELLANEOUS TEST

## 2025-01-05 NOTE — ED Notes (Signed)
 1448 pulse check, no pulse Compressions resumed

## 2025-01-05 NOTE — Progress Notes (Signed)
" °   2025-01-01 1600  Spiritual Encounters  Type of Visit Initial  Care provided to: Family  Referral source Nurse (RN/NT/LPN)  Reason for visit Patient death  OnCall Visit Yes   Chaplain provided support to family as they processed the death of family member.  "

## 2025-01-05 NOTE — Progress Notes (Signed)
 ED Pharmacy Antibiotic Sign Off An antibiotic consult was received from an ED provider for Vancomycin  per pharmacy dosing for pneumonia. A chart review was completed to assess appropriateness.   The following one time order(s) were placed:  Vancomycin  1750 mg IV x 1  Further antibiotic and/or antibiotic pharmacy consults should be ordered by the admitting provider if indicated.   Thank you for allowing pharmacy to be a part of this patient's care.    Ransom Blanch PGY-1 Pharmacy Resident  [place] [facility]  12/22/24 11:51 AM

## 2025-01-05 NOTE — ED Notes (Signed)
 1444 1 mg Epi administered 1446 pulse check, no pulse Compressions resumed

## 2025-01-05 NOTE — Death Summary Note (Signed)
 " DEATH SUMMARY   Patient Details  Name: Stephen [name] MRN: [id number] DOB: [birth year]  Admission/Discharge Information   Admit Date:  2024/12/12  Date of Death: Date of Death: Dec 12, 2024  Time of Death: Time of Death: 21-Dec-1515  Length of Stay: 0  Referring Physician: Lorel Maxie LABOR, MD   Reason(s) for Hospitalization  Cardiac Arrest in the setting of severe pneumonia   Diagnoses  Preliminary cause of death:  Secondary Diagnoses (including complications and co-morbidities):  Principal Problem:   CHF (congestive heart failure) (HCC) Active Problems:   Community acquired bacterial pneumonia   Brief Hospital Course (including significant findings, care, treatment, and services provided and events leading to death)  64 year old male patient with past medical history of remote colostomy for a perforated colon 11 years ago, tobacco use disorder, clinical diagnosis of COPD and type 2 diabetes mellitus presenting to [facility] on 12-12-24 for progressively worsening shortness of breath for the past 3 days. Reports he was in his usual state up until 3 days ago when he started having progressively worsening shortness of breath.  This was associated with a productive cough.  He also noted soaking night sweats over the past 3 days. He denies any respiratory issues before that however he does have an albuterol  inhaler at home.  Unfortunately continues to smoke.  He denies any chest pain, PND, leg swelling.   ED: Noted to be hypoxic down to the 80s was placed on nasal cannula at 6 L with good response. Hemodynamic stable. Labs with neutropenia with WBC of 1.3, H&H within normal limits.  Platelets within normal limits. Sodium 126, CO2 14, creatinine 3.72 with a BUN of 55 unknown baseline.  Mildly elevated LFTs.  BNP significantly elevated at greater than 35,000.  Lactate 8.6. Chest x-ray with left mid and lower lung consolidative opacity.  Bedside echocardiogram with appropriately functioning LV.  RV  systolic function appears normal.  Mild TR noted.  Patient started on ceftriaxone  azithromycin  and vancomycin .  Received 500 cc LR bolus.  CT Chest with large left Consolidation. ABG with metabolic acidosis Ph 7.12. I asked the RT to place him on Bipap and  after an hour he went into PEA arrest.        At around 2:45 pm I was called as patient suddenly went into PEA arrest, code was being run by the ED team [facility] see code note for details]. We continued the code for 35 min but never got a pulse, family elected to stop CPR. Patient passed away at 15:17.  Pertinent Labs and Studies  Significant Diagnostic Studies ECHOCARDIOGRAM COMPLETE Result Date: 12-Dec-2024    ECHOCARDIOGRAM REPORT   Patient Name:   Stephen [name] Date of Exam: 12-12-2024 Medical Rec #:  [id number]          Height:       68.0 in Accession #:    [id number]         Weight:       164.0 lb Date of Birth:  May 10, 1961          BSA:          1.879 m Patient Age:    64 years           BP:           120/76 mmHg Patient Gender: M                  HR:           114  bpm. Exam Location:  [facility] Procedure: 2D Echo, Cardiac Doppler, Color Doppler and Strain Analysis (Both            Spectral and Color Flow Doppler were utilized during procedure). STAT ECHO Indications:     Dyspnea R06.00  History:         Patient has no prior history of Echocardiogram examinations.  Sonographer:     Thedora Louder RDCS, FASE Referring Phys:  [id number] DARRIN BARN Diagnosing Phys: Keller Paterson  Sonographer Comments: Technically difficult study due to poor echo windows and suboptimal parasternal window. Image acquisition challenging due to respiratory motion. Global longitudinal strain was attempted. Test performed with patient positioned supine  and on Bipap. IMPRESSIONS  1. Left ventricular ejection fraction, by estimation, is 55 to 60%. The left ventricle has normal function. The left ventricle has no regional wall motion abnormalities. Left ventricular  diastolic parameters were normal.  2. Right ventricular systolic function is normal. The right ventricular size is normal.  3. The mitral valve is normal in structure. Trivial mitral valve regurgitation.  4. The aortic valve was not well visualized. Aortic valve regurgitation is not visualized.  5. The inferior vena cava is normal in size with greater than 50% respiratory variability, suggesting right atrial pressure of 3 mmHg. FINDINGS  Left Ventricle: Left ventricular ejection fraction, by estimation, is 55 to 60%. The left ventricle has normal function. The left ventricle has no regional wall motion abnormalities. The left ventricular internal cavity size was normal in size. There is  no left ventricular hypertrophy. Left ventricular diastolic parameters were normal. Right Ventricle: The right ventricular size is normal. No increase in right ventricular wall thickness. Right ventricular systolic function is normal. Left Atrium: Left atrial size was normal in size. Right Atrium: Right atrial size was normal in size. Pericardium: There is no evidence of pericardial effusion. Mitral Valve: The mitral valve is normal in structure. Trivial mitral valve regurgitation. Tricuspid Valve: The tricuspid valve is normal in structure. Tricuspid valve regurgitation is trivial. Aortic Valve: The aortic valve was not well visualized. Aortic valve regurgitation is not visualized. Aortic valve peak gradient measures 6.2 mmHg. Pulmonic Valve: The pulmonic valve was not well visualized. Pulmonic valve regurgitation is not visualized. Aorta: The aortic root is normal in size and structure. Venous: The inferior vena cava is normal in size with greater than 50% respiratory variability, suggesting right atrial pressure of 3 mmHg. IAS/Shunts: The atrial septum is grossly normal.  LEFT VENTRICLE PLAX 2D LVIDd:         4.50 cm     Diastology LVIDs:         3.30 cm     LV e' medial:    11.20 cm/s LV PW:         1.00 cm     LV E/e' medial:   10.1 LV IVS:        1.10 cm     LV e' lateral:   10.70 cm/s LVOT diam:     2.00 cm     LV E/e' lateral: 10.6 LV SV:         60 LV SV Index:   32 LVOT Area:     3.14 cm  LV Volumes (MOD) LV vol d, MOD A2C: 92.3 ml LV vol d, MOD A4C: 89.5 ml LV vol s, MOD A2C: 34.3 ml LV vol s, MOD A4C: 37.2 ml LV SV MOD A2C:     58.0 ml LV SV MOD A4C:  89.5 ml LV SV MOD BP:      53.4 ml RIGHT VENTRICLE RV Basal diam:  3.10 cm RV S prime:     14.10 cm/s TAPSE (M-mode): 1.8 cm LEFT ATRIUM             Index        RIGHT ATRIUM           Index LA diam:        2.90 cm 1.54 cm/m   RA Area:     11.20 cm LA Vol (A2C):   54.2 ml 28.85 ml/m  RA Volume:   25.80 ml  13.73 ml/m LA Vol (A4C):   27.4 ml 14.59 ml/m LA Biplane Vol: 41.6 ml 22.14 ml/m  AORTIC VALVE AV Area (Vmax): 3.01 cm AV Vmax:        124.00 cm/s AV Peak Grad:   6.2 mmHg LVOT Vmax:      119.00 cm/s LVOT Vmean:     70.700 cm/s LVOT VTI:       0.192 m  AORTA Ao Root diam: 3.60 cm MITRAL VALVE MV Area (PHT): 6.37 cm     SHUNTS MV Decel Time: 119 msec     Systemic VTI:  0.19 m MV E velocity: 113.00 cm/s  Systemic Diam: 2.00 cm MV A velocity: 110.00 cm/s MV E/A ratio:  1.03 Keller Alluri Electronically signed by Keller Paterson Signature Date/Time: 2024-12-28/2:25:37 PM    Final    CT CHEST WO CONTRAST Result Date: 2024-12-28 CLINICAL DATA:  Cough, shortness of breath EXAM: CT CHEST WITHOUT CONTRAST TECHNIQUE: Multidetector CT imaging of the chest was performed following the standard protocol without IV contrast. RADIATION DOSE REDUCTION: This exam was performed according to the departmental dose-optimization program which includes automated exposure control, adjustment of the mA and/or kV according to patient size and/or use of iterative reconstruction technique. COMPARISON:  Radiograph of same day FINDINGS: Cardiovascular: No significant vascular findings. Normal heart size. No pericardial effusion. Mediastinum/Nodes: No enlarged mediastinal or axillary lymph nodes.  Thyroid gland, trachea, and esophagus demonstrate no significant findings. Lungs/Pleura: No pneumothorax or pleural effusion is noted. Emphysematous disease is noted. Bilateral patchy airspace opacities are noted consistent with multifocal pneumonia. This is most prominently seen in the left lower lobe. Upper Abdomen: No acute abnormality. Musculoskeletal: No chest wall mass or suspicious bone lesions identified. IMPRESSION: 1. Bilateral patchy airspace opacities are noted consistent with multifocal pneumonia. This is most prominently seen in the left lower lobe. 2. Emphysema. Emphysema (ICD10-J43.9). Electronically Signed   By: Lynwood Landy Raddle M.D.   On: 12-28-2024 13:18   DG Chest Port 1 View Result Date: 2024/12/28 EXAM: 1 VIEW(S) XRAY OF THE CHEST Dec 28, 2024 10:59:00 AM COMPARISON: 03/09/11. CLINICAL HISTORY: Questionable sepsis - evaluate for abnormality FINDINGS: LUNGS AND PLEURA: Diffuse airspace opacities in left mid and lower lung. Small left pleural effusion. Prominent interstitial markings in right lower lung. No pneumothorax. The most likely differential diagnosis based on these findings includes pneumonia, acute respiratory distress syndrome (ARDS), or pulmonary edema. HEART AND MEDIASTINUM: No acute abnormality of the cardiac and mediastinal silhouettes. BONES AND SOFT TISSUES: No acute osseous abnormality. IMPRESSION: 1. Left mid and lower lung pneumonia with small left pleural effusion. 2. Consider aspiration and asymmetric pulmonary edema in the differential; follow-up chest radiograph in 6-8 weeks to document resolution. Electronically signed by: Waddell Calk MD Dec 28, 2024 11:06 AM EST RP Workstation: [id number]    Microbiology Recent Results (from the past 240 hours)  Resp panel by RT-PCR (  RSV, Flu A&B, Covid) Anterior Nasal Swab     Status: None   Collection Time: 2024-12-15 12:22 PM   Specimen: Anterior Nasal Swab  Result Value Ref Range Status   SARS Coronavirus 2 by RT PCR NEGATIVE  NEGATIVE Final    Comment: (NOTE) SARS-CoV-2 target nucleic acids are NOT DETECTED.  The SARS-CoV-2 RNA is generally detectable in upper respiratory specimens during the acute phase of infection. The lowest concentration of SARS-CoV-2 viral copies this assay can detect is 138 copies/mL. A negative result does not preclude SARS-Cov-2 infection and should not be used as the sole basis for treatment or other patient management decisions. A negative result may occur with  improper specimen collection/handling, submission of specimen other than nasopharyngeal swab, presence of viral mutation(s) within the areas targeted by this assay, and inadequate number of viral copies(<138 copies/mL). A negative result must be combined with clinical observations, patient history, and epidemiological information. The expected result is Negative.  Fact Sheet for Patients:  bloggercourse.com  Fact Sheet for Healthcare Providers:  seriousbroker.it  This test is no t yet approved or cleared by the [place]  FDA and  has been authorized for detection and/or diagnosis of SARS-CoV-2 by FDA under an Emergency Use Authorization (EUA). This EUA will remain  in effect (meaning this test can be used) for the duration of the COVID-19 declaration under Section 564(b)(1) of the Act, 21 U.S.C.section 360bbb-3(b)(1), unless the authorization is terminated  or revoked sooner.       Influenza A by PCR NEGATIVE NEGATIVE Final   Influenza B by PCR NEGATIVE NEGATIVE Final    Comment: (NOTE) The Xpert Xpress SARS-CoV-2/FLU/RSV plus assay is intended as an aid in the diagnosis of influenza from Nasopharyngeal swab specimens and should not be used as a sole basis for treatment. Nasal washings and aspirates are unacceptable for Xpert Xpress SARS-CoV-2/FLU/RSV testing.  Fact Sheet for Patients: bloggercourse.com  Fact Sheet for Healthcare  Providers: seriousbroker.it  This test is not yet approved or cleared by the [place]  FDA and has been authorized for detection and/or diagnosis of SARS-CoV-2 by FDA under an Emergency Use Authorization (EUA). This EUA will remain in effect (meaning this test can be used) for the duration of the COVID-19 declaration under Section 564(b)(1) of the Act, 21 U.S.C. section 360bbb-3(b)(1), unless the authorization is terminated or revoked.     Resp Syncytial Virus by PCR NEGATIVE NEGATIVE Final    Comment: (NOTE) Fact Sheet for Patients: bloggercourse.com  Fact Sheet for Healthcare Providers: seriousbroker.it  This test is not yet approved or cleared by the [place]  FDA and has been authorized for detection and/or diagnosis of SARS-CoV-2 by FDA under an Emergency Use Authorization (EUA). This EUA will remain in effect (meaning this test can be used) for the duration of the COVID-19 declaration under Section 564(b)(1) of the Act, 21 U.S.C. section 360bbb-3(b)(1), unless the authorization is terminated or revoked.  Performed at [facility], [address] [place], [place] [address]     Lab Basic Metabolic Panel: Recent Labs  Lab 12/15/2024 1058  NA 126*  K 4.6  CL 84*  CO2 14*  GLUCOSE 183*  BUN 55*  CREATININE 3.72*  CALCIUM  8.6*   Liver Function Tests: Recent Labs  Lab 12-15-2024 1058  AST 73*  ALT 47*  ALKPHOS 179*  BILITOT 0.8  PROT 7.3  ALBUMIN 3.6   No results for input(s): LIPASE, AMYLASE in the last 168 hours. No results for input(s): AMMONIA in the  last 168 hours. CBC: Recent Labs  Lab 12-28-24 1058  WBC 1.3*  NEUTROABS 0.7*  HGB 13.0  HCT 37.7*  MCV 93.3  PLT 171   Cardiac Enzymes: No results for input(s): CKTOTAL, CKMB, CKMBINDEX, TROPONINI in the last 168 hours. Sepsis Labs: Recent Labs  Lab 2024/12/28 1058 12/28/2024 1247  WBC 1.3*   --   LATICACIDVEN 8.6* >9.0*     Jean-Pierre Bonnee Zertuche 2024/12/28, 3:44 PM   "

## 2025-01-05 NOTE — Progress Notes (Signed)
" °  Echocardiogram 2D Echocardiogram has been performed.  Stephen [name] Louder 12/13/2024, 2:05 PM "

## 2025-01-05 NOTE — ED Provider Notes (Signed)
----------------------------------------- °  3:31 PM on 12/22/2024 -----------------------------------------   Patient noted to have sudden acute pulseless cardiac arrest with initial asystolic rhythm at approximately 2:45 PM.  Responded to the code.  CPR already initiated by Dr. Jossie, and airway manage with bag-valve-mask ventilation for extended period of time as the patient had significant trismus.  Multiple rounds of CPR and pulse checks initial several hour round showing no pulse with asystole.  Dr. Malka also responded.  I am confident that we are able to ventilate the patient well but he had severe trismus despite repeated doses of rocuronium  80 mg and 40 mg as well as CPR he remained with significant trismus.  He was able to be ventilated though with oxygen saturations in the high 80s to low 90s with BVM.  Eventually as trismus did not resolve consider options such as cricothyrotomy, fiberoptic intubation, blind nasotracheal intubation etc.  And ultimately decision to use fiberoptic, and the patient was successfully intubated on second attempt with fiberoptic intubation.  End-tidal CO2 confirmed with color change, and via visual the endotracheal tube is seen to clearly terminate just above the mainstem bronchus on the GlideScope.  Affirmed by both myself as well as ICU physician   After approximately 30+ minutes of CPR with no return of circulation, patient's family at the bedside including his wife and sister wife advising that she is his closest family and he does not have an otherwise prescribed healthcare power of attorney.  Wife advises that both she and her husband had discussed situations like this and that he would not want to be sustained by a heroic measures CPR.  At this time ICU physician Dr. Malka continuing-care at the bedside-in patient's family as indicated in CODE STATUS is changed to DNR.  He does have a very slow faint pulse with bradycardia now with a rate of about  30.  Return of circulation is present but extremely weak.  ICU continuing care     Dicky Anes, MD 12-22-24 1534

## 2025-01-05 NOTE — ED Provider Notes (Signed)
 "  [facility] Provider Note    Event Date/Time   First MD Initiated Contact with Patient 2025-01-05 1045     (approximate)   History   Shortness of breath  HPI  Stephen [name] is a 64 y.o. male hypertension hyperlipidemia and diabetes mellitus  Patient describes a few days of cough shortness of breath feeling sweaty.  Called EMS because of shortness of breath.  Was noted to be hypoxic on room air saturation in low 80s.  EMS started patient on oxygen.  EKG was confused pleated to home nebulizer therapies prior to calling EMS.  He is having no pain.  Reports has been short of breath feeling sweaty off-and-on for the last couple of days.  Shortness of breath is worsening.  No leg swelling.  He has noticed a slight rash on both of his knees and the areas that he typically wears his knee braces.  Other than that no rash.  He is not aware of any fever but reports he has been sweating quite a bit.  He has been feeling sick and short of breath he has not traveled outside the country.  He has not had any recent hospitalizations  He reports he has no allergy [medical record notes latex]   Reviewed external records from Dr. Reynaldo in 2023  History reviewed. No pertinent past medical history.   Physical Exam   Triage Vital Signs: ED Triage Vitals  Encounter Vitals Group     BP      Girls Systolic BP Percentile      Girls Diastolic BP Percentile      Boys Systolic BP Percentile      Boys Diastolic BP Percentile      Pulse      Resp      Temp      Temp src      SpO2      Weight      Height      Head Circumference      Peak Flow      Pain Score      Pain Loc      Pain Education      Exclude from Growth Chart     Most recent vital signs: Vitals:   01-05-25 1059 01-05-2025 1101  BP:  120/76  Pulse:  60  Resp:  (!) 26  Temp:  (!) 96.6 F (35.9 C)  SpO2: (!) 80% 94%     General: Awake, with mildly labored breathing with slight accessory muscle use  and tachypnea.  Oxygen saturation in the mid 70s on room air briefly when transferred from EMS stretcher.  Oxygen improving to about 88% on 6 L nasal cannula for which we will now initiate high flow humidified at 10 L  He does not appear to have respiratory fatigue or severe extremis but certainly appears dyspneic and hypoxic  CV:   Good peripheral perfusion. Normal rate and heart tones. Resp:  Crackles noted primarily in the lung bases right significantly more so than left.  Scant expiratory wheezing.  Takes good volumes of does not appear to be fatiguing.  Does not appear to be obviously acutely retaining Abd:  Soft, non-tender to palpation in all quadrants. No rebound or guarding.  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  Patient denies any abdominal symptoms or pain Neuro:  Fully alert well-oriented no focal neuro deficits noted. Moves extremities well without noted concern. Other:  Slight raised lacy type rash  with slow capillary refill over the lower legs bilateral.  Centered over the anterior knees and area he reports he typically wears knee braces.  Query if this may be related to slowed capillary perfusion in the area where he would typically have his brace on but has since taken it off.  Does not appear obviously purpuric, there is no necrosis there is no other areas of rash on the chest back face arms legs.  Capillary refill is slow takes about 2 seconds at nailbeds   ED Results / Procedures / Treatments   Labs (all labs ordered are listed, but only abnormal results are displayed) Labs Reviewed  BLOOD GAS, VENOUS - Abnormal; Notable for the following components:      Result Value   pH, Ven 7.21 (*)    pCO2, Ven 34 (*)    Bicarbonate 13.6 (*)    Acid-base deficit 13.2 (*)    All other components within normal limits  LACTIC ACID, PLASMA - Abnormal; Notable for the following components:   Lactic Acid, Venous 8.6 (*)    All other components within normal limits   COMPREHENSIVE METABOLIC PANEL WITH GFR - Abnormal; Notable for the following components:   Sodium 126 (*)    Chloride 84 (*)    CO2 14 (*)    Glucose, Bld 183 (*)    BUN 55 (*)    Creatinine, Ser 3.72 (*)    Calcium  8.6 (*)    AST 73 (*)    ALT 47 (*)    Alkaline Phosphatase 179 (*)    GFR, Estimated 17 (*)    Anion gap 29 (*)    All other components within normal limits  CBC WITH DIFFERENTIAL/PLATELET - Abnormal; Notable for the following components:   WBC 1.3 (*)    RBC 4.04 (*)    HCT 37.7 (*)    nRBC 1.6 (*)    Neutro Abs 0.7 (*)    Lymphs Abs 0.2 (*)    Monocytes Absolute 0.0 (*)    Abs Immature Granulocytes 0.42 (*)    All other components within normal limits  PROTIME-INR - Abnormal; Notable for the following components:   Prothrombin Time 19.6 (*)    INR 1.6 (*)    All other components within normal limits  PRO BRAIN NATRIURETIC PEPTIDE - Abnormal; Notable for the following components:   Pro Brain Natriuretic Peptide >35,000.0 (*)    All other components within normal limits  RESP PANEL BY RT-PCR (RSV, FLU A&B, COVID)  RVPGX2  CULTURE, BLOOD (ROUTINE X 2)  CULTURE, BLOOD (ROUTINE X 2)  MRSA NEXT GEN BY PCR, NASAL  RESPIRATORY PANEL BY PCR  TECHNOLOGIST SMEAR REVIEW  LACTIC ACID, PLASMA  URINALYSIS, W/ REFLEX TO CULTURE (INFECTION SUSPECTED)  PATHOLOGIST SMEAR REVIEW  HIV ANTIBODY (ROUTINE TESTING W REFLEX)  TROPONIN T, HIGH SENSITIVITY     EKG  I independently reviewed the EKG at 1050 heart rate 105 QRS 100 QTc 460 sinus tachycardia, incomplete right bundle.  No ST segment elevation or frank ischemic changes noted, slight artifact present.   RADIOLOGY I independently reviewed images of chest x-ray which include opacifications primarily of the left chest    PROCEDURES:  Critical Care performed: Yes, see critical care procedure note(s)   CRITICAL CARE Performed by: Oneil Budge   Total critical care time: 45 minutes  Critical care time was  exclusive of separately billable procedures and treating other patients.  Critical care was necessary to treat or prevent imminent or  life-threatening deterioration.  Critical care was time spent personally by me on the following activities: development of treatment plan with patient and/or surrogate as well as nursing, discussions with consultants, evaluation of patient's response to treatment, examination of patient, obtaining history from patient or surrogate, ordering and performing treatments and interventions, ordering and review of laboratory studies, ordering and review of radiographic studies, pulse oximetry and re-evaluation of patient's condition.   Procedures   MEDICATIONS ORDERED IN ED: Medications  azithromycin  (ZITHROMAX ) 500 mg in sodium chloride  0.9 % 250 mL IVPB (500 mg Intravenous New Bag/Given 2024/12/20 1159)  vancomycin  (VANCOREADY) IVPB 1750 mg/350 mL (has no administration in time range)  Chlorhexidine  Gluconate Cloth 2 % PADS 6 each (has no administration in time range)  polyethylene glycol (MIRALAX  / GLYCOLAX ) packet 17 g (has no administration in time range)  senna (SENOKOT) tablet 8.6 mg (has no administration in time range)  ipratropium-albuterol  (DUONEB) 0.5-2.5 (3) MG/3ML nebulizer solution 3 mL (3 mLs Nebulization Given 2024-12-20 1106)  ipratropium-albuterol  (DUONEB) 0.5-2.5 (3) MG/3ML nebulizer solution 3 mL (3 mLs Nebulization Given 2024/12/20 1106)  methylPREDNISolone  sodium succinate (SOLU-MEDROL ) 125 mg/2 mL injection 125 mg (125 mg Intravenous Given 20-Dec-2024 1109)  cefTRIAXone  (ROCEPHIN ) 2 g in sodium chloride  0.9 % 100 mL IVPB (0 g Intravenous Stopped Dec 20, 2024 1142)  lactated ringers  bolus 1,500 mL (0 mLs Intravenous Stopped 12/20/2024 1213)     IMPRESSION / MDM / ASSESSMENT AND PLAN / ED COURSE  I reviewed the triage vital signs and the nursing notes.                              Based on presentation, the differential diagnosis includes, but is not limited to key  considerations: shortness of breath DIFFERENTIAL DIAGNOSIS CONSIDERATIONS: High-acuity etiologies considered and prioritized for rule-out: - Pulmonary Embolism - Acute Decompensated Heart Failure - COPD / Asthma Exacerbation - Tension Pneumothorax - Pneumonia with Sepsis - Upper Airway Obstruction / Anaphylaxis - Acute Coronary Syndrome (Anginal Equivalent) - Metabolic Acidosis (DKA/Sepsis)   His presentation and exam seem highly suggestive of infectious cause.  Is also flu season.  I am concerned about pneumonia to the point on his presentation and I am comfortable initiating antibiotics for community-acquired pneumonia as we further his workup obtain imaging x-rays labs etc.  His hemodynamics are relatively reassuring except he is noted to have some hypothermia and tachypnea  No obvious clinical findings to suggest thromboembolism.  No chest pain no pleuritic pain he does not have any unilateral leg swelling or edema or findings highly suggestive of venous cords congestion or DVT.  He does relate chills productive cough sweats worsening over 3 days.  Most concerning for obvious infectious but we will certainly continue to workup for other causes well.  No frank cardiac symptoms  Initial lactic acid severely elevated  This is not an exhaustive list.   Patient's presentation is most consistent with acute presentation with potential threat to life or bodily function.    The patient is on the cardiac monitor to evaluate for evidence of arrhythmia and/or significant heart rate changes.  Clinical Course as of 12/20/24 1246  [place] 20-Dec-2024  1149 Noted and severe lactic acidosis.  Notable neutropenia.  I have placed consult with ICU Dr. Malka.  Patient condition no significant change resting comfortably maintaining oxygen saturation in the high 90s currently on 6 L nasal cannula [MQ]  1212 Initially with presentation I was quite concerned  about pneumonia and I remained so.  He also though  has evidence of acute kidney injury and his BNP is notably elevated, he has no prior echocardiogram.  Query if this could represent pulmonary edema, pneumonia or combination of both thereof.  He is being admitted to the ICU service under the care of Dr. Malka.  I will stop further IV fluid bolus at this point and defer to the ICU team's management as I do not wish to worsen symptomatology if in fact there is a significant element of pulmonary edema on top of AKI.  He may need less judicious of bolusing or not the full 30 mL/kg bolus often associated with sepsis treatment as this could also be pulmonary, cardiogenic, infectious or of other nature as well in which case large fluid bolus could potentially lead to worsening of his condition. [MQ]  1219 Patient being admitted to the ICU team.  They do request consult with cardiology soon as possible based on the clinical history.  I have placed cardiology consult, have paged and also sent [facility] message requesting consult today as soon as possible to Dr. Wilburn of [facility] [MQ]  1222 Patient agreeable with admission.  ICU physician also requesting I stop IV fluids based on the labs that have now returned, this is already been done.  Plan to discuss and obtain urgent consult with cardiology today as well.  A cardiac consult would likely be of high yield as we differentiate out etiology [MQ]  1245 Consulted with Dr. Wilburn, he is seeing patient in ER now. Dr. Malka has already seen and is admitting.  Further care and treatment per critical care medicine service [MQ]    Clinical Course User Index [MQ] Dicky Anes, MD   Troponin pending.  If troponin severely elevated perhaps in consideration of his clinical symptoms acute cardiac event may have happened 3 days ago leading him to now have developing cardiogenic symptoms though this is still pending and cardiology team is currently seeing the patient.  He has no symptoms of chest pain however  FINAL CLINICAL  IMPRESSION(S) / ED DIAGNOSES   Final diagnoses:  Acute hypoxemic respiratory failure (HCC)  Metabolic acidosis  Acute kidney injury   Rx / DC Orders   ED Discharge Orders     None        Note:  This document was prepared using Dragon voice recognition software and may include unintentional dictation errors.   Dicky Anes, MD 12-09-24 1247  "

## 2025-01-05 NOTE — Consult Note (Signed)
 [facility] Cardiology  CARDIOLOGY CONSULT NOTE  Patient ID: ALYSSA MANCERA MRN: [id number] DOB/AGE: 1961/08/23 64 y.o.  Admit date: Dec 24, 2024 Referring Physician Dr. Dicky Reason for Consultation shock  HPI: 64 year old male on whom our service is consulted for shock, significant proBNP elevation.  Past medical history significant for colon perforation 11 years ago with remote colostomy, tobacco use disorder, possible COPD, type 2 diabetes.  He was at his usual state of health until 3 days ago and started to develop gradually worsening shortness of breath.  Associated with cough, chills and soaking sweats.  No chest pain/pressure or orthopnea.  Due to worsening symptoms came to ED.  Noted to be hypoxic in 80s and started on oxygen through nasal cannula.  EKG showed mild sinus tachycardia without any significant ischemic changes.  Labs with metabolic acidosis, significant lactic acid 8.6, leukopenia-WBC 1.3, proBNP greater than 35,000.  Chest x-ray with left lower lobe pneumonia.  Review of systems complete and found to be negative unless listed above     History reviewed. No pertinent past medical history.  History reviewed. No pertinent surgical history.  (Not in a hospital admission)  Social History   Socioeconomic History   Marital status: Single    Spouse name: Not on file   Number of children: Not on file   Years of education: Not on file   Highest education level: Not on file  Occupational History   Not on file  Tobacco Use   Smoking status: Every Day    Types: Cigarettes   Smokeless tobacco: Not on file  Substance and Sexual Activity   Alcohol use: Not on file   Drug use: Not on file   Sexual activity: Not on file  Other Topics Concern   Not on file  Social History Narrative   Not on file   Social Drivers of Health   Tobacco Use: High Risk (2024/12/24)   Patient History    Smoking Tobacco Use: Every Day    Smokeless Tobacco Use: Unknown    Passive Exposure: Not on  file  Financial Resource Strain: Not on file  Food Insecurity: Not on file  Transportation Needs: Not on file  Physical Activity: Not on file  Stress: Not on file  Social Connections: Not on file  Intimate Partner Violence: Not on file  Depression (EYV7-0): Not on file  Alcohol Screen: Not on file  Housing: Not on file  Utilities: Not on file  Health Literacy: Not on file    History reviewed. No pertinent family history.    Review of systems complete and found to be negative unless listed above      PHYSICAL EXAM  Increased work of breathing Alert and oriented x 3 No significant murmur Left lower lobe crackles heard  Labs:   Lab Results  Component Value Date   WBC 1.3 (LL) Dec 24, 2024   HGB 13.0 24-Dec-2024   HCT 37.7 (L) 2024/12/24   MCV 93.3 December 24, 2024   PLT 171 12/24/2024    Recent Labs  Lab 12/24/2024 1058  NA 126*  K 4.6  CL 84*  CO2 14*  BUN 55*  CREATININE 3.72*  CALCIUM  8.6*  PROT 7.3  BILITOT 0.8  ALKPHOS 179*  ALT 47*  AST 73*  GLUCOSE 183*   No results found for: CKTOTAL, CKMB, CKMBINDEX, TROPONINI No results found for: CHOL No results found for: HDL No results found for: LDLCALC No results found for: TRIG No results found for: CHOLHDL No results found for: LDLDIRECT  Radiology: CT CHEST WO CONTRAST Result Date: 12/13/2024 CLINICAL DATA:  Cough, shortness of breath EXAM: CT CHEST WITHOUT CONTRAST TECHNIQUE: Multidetector CT imaging of the chest was performed following the standard protocol without IV contrast. RADIATION DOSE REDUCTION: This exam was performed according to the departmental dose-optimization program which includes automated exposure control, adjustment of the mA and/or kV according to patient size and/or use of iterative reconstruction technique. COMPARISON:  Radiograph of same day FINDINGS: Cardiovascular: No significant vascular findings. Normal heart size. No pericardial effusion. Mediastinum/Nodes: No  enlarged mediastinal or axillary lymph nodes. Thyroid gland, trachea, and esophagus demonstrate no significant findings. Lungs/Pleura: No pneumothorax or pleural effusion is noted. Emphysematous disease is noted. Bilateral patchy airspace opacities are noted consistent with multifocal pneumonia. This is most prominently seen in the left lower lobe. Upper Abdomen: No acute abnormality. Musculoskeletal: No chest wall mass or suspicious bone lesions identified. IMPRESSION: 1. Bilateral patchy airspace opacities are noted consistent with multifocal pneumonia. This is most prominently seen in the left lower lobe. 2. Emphysema. Emphysema (ICD10-J43.9). Electronically Signed   By: Lynwood Landy Raddle M.D.   On: 2024/12/13 13:18   DG Chest Port 1 View Result Date: 2024/12/13 EXAM: 1 VIEW(S) XRAY OF THE CHEST 12-13-2024 10:59:00 AM COMPARISON: 03/09/11. CLINICAL HISTORY: Questionable sepsis - evaluate for abnormality FINDINGS: LUNGS AND PLEURA: Diffuse airspace opacities in left mid and lower lung. Small left pleural effusion. Prominent interstitial markings in right lower lung. No pneumothorax. The most likely differential diagnosis based on these findings includes pneumonia, acute respiratory distress syndrome (ARDS), or pulmonary edema. HEART AND MEDIASTINUM: No acute abnormality of the cardiac and mediastinal silhouettes. BONES AND SOFT TISSUES: No acute osseous abnormality. IMPRESSION: 1. Left mid and lower lung pneumonia with small left pleural effusion. 2. Consider aspiration and asymmetric pulmonary edema in the differential; follow-up chest radiograph in 6-8 weeks to document resolution. Electronically signed by: Waddell Calk MD 2024/12/13 11:06 AM EST RP Workstation: [id number]    EKG: Mild sinus tachycardia  ASSESSMENT AND PLAN:  Acute hypoxic respiratory failure Severe sepsis Lactic acidosis Acute kidney injury Possible COPD, tobacco abuse Type 2 diabetes  Patient presented with worsening shortness of  breath, hypoxic respiratory failure, significant lactic acidosis, AKI.  proBNP also greater than 35,000 with initial concern for possible cardiac etiology.  Bedside echo in ED with overall preserved ventricular function.  After my evaluation CT chest also performed which is consistent with multifocal pneumonia with significant consolidation in left lower lobe.  His presentation is secondary to pneumonia- being admitted to ICU, respiratory support with BiPAP and antibiotics per critical care team. Stat echo ordered in ED, I will review once done to accurately assess ventricular and valvular function.  Signed: Keller JAYSON Paterson MD 12/13/24, 1:21 PM

## 2025-01-05 NOTE — Progress Notes (Signed)
 Elink following for sepsis protocol.

## 2025-01-05 NOTE — H&P (Addendum)
 "  NAME:  Stephen [name], MRN:  [id number], DOB:  [birth year], LOS: 0 ADMISSION DATE:  01/07/25, CHIEF COMPLAINT:  Acute hypoxic respiratory failure   History of Present Illness:  64 year old male patient with past medical history of remote colostomy for a perforated colon 11 years ago, tobacco use disorder, clinical diagnosis of COPD and type 2 diabetes mellitus presenting to [facility] on 07-Jan-2025 for progressively worsening shortness of breath for the past 3 days.  Reports he was in his usual state up until 3 days ago when he started having progressively worsening shortness of breath.  This was associated with a productive cough.  He also noted soaking night sweats over the past 3 days.  He denies any respiratory issues before that however he does have an albuterol  inhaler at home.  Unfortunately continues to smoke.  He denies any chest pain, PND, leg swelling.  ED: Noted to be hypoxic down to the 80s was placed on nasal cannula at 6 L with good response.  Hemodynamic stable.  Labs with neutropenia with WBC of 1.3, H&H within normal limits.  Platelets within normal limits.  Sodium 126, CO2 14, creatinine 3.72 with a BUN of 55 unknown baseline.  Mildly elevated LFTs.  BNP significantly elevated at greater than 35,000.  Lactate 8.6.  Chest x-ray with left mid and lower lung consolidative opacity.  Bedside echocardiogram with appropriately functioning LV.  RV systolic function appears normal.  Mild TR noted.  Patient started on ceftriaxone  azithromycin  and vancomycin .  Received 500 cc LR bolus.  Will be admitted to the ICU for further management.   Significant Hospital Events: Including procedures, antibiotic start and stop dates in addition to other pertinent events   2025-01-07 - Admit to ICU on Bipap.   Objective    Blood pressure 120/76, pulse 60, temperature (!) 96.6 F (35.9 C), temperature source Axillary, resp. rate (!) 26, height 5' 8 (1.727 m), weight 74.4 kg, SpO2 94%.        No intake or output data in the 24 hours ending 01/07/2025 1309 Filed Weights   01/07/25 1059 01-07-25 1149  Weight: 74.4 kg 74.4 kg    Examination: General: Frail, in mild respiratory distress on [place] HENT: Supple neck, reactive pupils, EOMI  Lungs: Ronchorous mostly over the left hemithorax Cardiovascular: Tachycardic, nomral S1, Normal S2, sinus Abdomen: Soft, non tender, non distended, ostomy bag noted Extremities: Warm, no edema  Labs and imaging were reviewed.   Assessment and Plan  64 year old male patient with past medical history of remote colostomy for a perforated colon 11 years ago, tobacco use disorder, clinical diagnosis of COPD and type 2 diabetes mellitus presenting to [facility] on 2025-01-07 for progressively worsening shortness of breath for the past 3 days. Found to have left lower lobe pneumonia requring bipap support.   #Acute hypoxic respiratory failure requiring BiPAP support secondary to # Left lower lobe pneumonia [chest x-ray with left mid to lower lung zone consolidative opacity] #?  CHF with elevated BNP 35,000 however bedside echocardiogram without any overt signs of LV dysfunction or RV dilation or dysfunction # PE less likely, low suspicion [low Wells score], RV with normal function on bedside # Neutropenia likely bone marrow suppression in setting of sepsis. # AKI with creatinine of 3.7 mg/dL with unknown baseline likely ATN in the setting of sepsis.   # Lactic acidosis secondary to the above  Neuro: Delirium precautions CVS: Maintain MAP greater than 65 mmHg.  Complete echocardiogram.  Pending troponin.  Repeat EKG.  Lungs: Maintain SpO2 89 to 92%.  BiPAP support for work of breathing. IV methylpred 40mg  daily. Duonebs Q6H scheduled. Flutter valve and IS to help with secretions.  ID: Urine strep and Legionella antigen, sputum culture, MRSA swab.  Agree with ceftriaxone  azithromycin  and vancomycin .  MRSA swab negative can DC vancomycin .  CT chest without  contrast. GI: Advance diet as tolerated.  No indication for PPI. Renal: LR 500 cc bolus.  Trend creatinine daily.  Monitor urine output. Heme: Heparin  subcu for DVT prophylaxis. Endo: POC 140-180 on ISS  Critical care time: 60 minutes    Darrin Barn, MD [place] [facility] 12/11/2024 1:17 PM   "

## 2025-01-05 NOTE — ED Notes (Signed)
Pt placed on [place]

## 2025-01-05 NOTE — ED Triage Notes (Signed)
 Pt from home via ems- began having some cough and sob a couple days ago, this am was in resp distress, called EMS, when ems arrived pt was 80% on room air, does not wear home o2. Pt was placed on 6L [place] and sats came up to low 90's. Pt cool and clammy with cough on arrival. Pt denies pain. Some discoloration noted to BLE around knee area. Pt was give duoneb pta with no relief.   CBG 156

## 2025-01-05 NOTE — Progress Notes (Signed)
 CODE SEPSIS - PHARMACY COMMUNICATION  **Broad Spectrum Antibiotics should be administered within 1 hour of Sepsis diagnosis**  Time Code Sepsis Called/Page Received: 10:46   Antibiotics Ordered: Ceftriaxone , azithromycin    Time of 1st antibiotic administration: 11:12  Additional action taken by pharmacy: N/A  If necessary, Name of Provider/Nurse Contacted: N/A   Ransom Blanch PGY-1 Pharmacy Resident  [place] - [facility]  2025/01/07 10:59 AM

## 2025-01-05 DEATH — deceased
